# Patient Record
Sex: Female | Born: 1980 | Race: Black or African American | Hispanic: No | Marital: Married | Smoking: Never smoker
Health system: Southern US, Community
[De-identification: ages and names within clinical notes are randomized; demographics above are authoritative.]

## PROBLEM LIST (undated history)

## (undated) ENCOUNTER — Inpatient Hospital Stay (HOSPITAL_COMMUNITY): Payer: Self-pay

## (undated) DIAGNOSIS — G971 Other reaction to spinal and lumbar puncture: Secondary | ICD-10-CM

## (undated) DIAGNOSIS — F419 Anxiety disorder, unspecified: Secondary | ICD-10-CM

## (undated) DIAGNOSIS — B999 Unspecified infectious disease: Secondary | ICD-10-CM

## (undated) DIAGNOSIS — R87629 Unspecified abnormal cytological findings in specimens from vagina: Secondary | ICD-10-CM

## (undated) DIAGNOSIS — G4700 Insomnia, unspecified: Secondary | ICD-10-CM

## (undated) DIAGNOSIS — Z22322 Carrier or suspected carrier of Methicillin resistant Staphylococcus aureus: Secondary | ICD-10-CM

## (undated) DIAGNOSIS — D649 Anemia, unspecified: Secondary | ICD-10-CM

## (undated) DIAGNOSIS — O139 Gestational [pregnancy-induced] hypertension without significant proteinuria, unspecified trimester: Secondary | ICD-10-CM

## (undated) HISTORY — PX: COLPOSCOPY: SHX161

## (undated) HISTORY — DX: Anxiety disorder, unspecified: F41.9

## (undated) HISTORY — DX: Other reaction to spinal and lumbar puncture: G97.1

## (undated) HISTORY — PX: ABDOMINAL HYSTERECTOMY: SHX81

## (undated) HISTORY — DX: Gestational (pregnancy-induced) hypertension without significant proteinuria, unspecified trimester: O13.9

## (undated) HISTORY — DX: Unspecified abnormal cytological findings in specimens from vagina: R87.629

---
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
## 2008-03-28 ENCOUNTER — Emergency Department (HOSPITAL_COMMUNITY): Admission: EM | Admit: 2008-03-28 | Discharge: 2008-03-28 | Payer: Self-pay | Admitting: Emergency Medicine

## 2008-06-08 DIAGNOSIS — E669 Obesity, unspecified: Secondary | ICD-10-CM

## 2008-06-22 ENCOUNTER — Inpatient Hospital Stay (HOSPITAL_COMMUNITY): Admission: AD | Admit: 2008-06-22 | Discharge: 2008-06-22 | Payer: Self-pay | Admitting: Obstetrics & Gynecology

## 2008-07-07 ENCOUNTER — Ambulatory Visit: Payer: Self-pay | Admitting: Family Medicine

## 2008-07-07 ENCOUNTER — Inpatient Hospital Stay (HOSPITAL_COMMUNITY): Admission: AD | Admit: 2008-07-07 | Discharge: 2008-07-07 | Payer: Self-pay | Admitting: Obstetrics

## 2008-07-28 ENCOUNTER — Ambulatory Visit (HOSPITAL_COMMUNITY): Admission: RE | Admit: 2008-07-28 | Discharge: 2008-07-28 | Payer: Self-pay | Admitting: Obstetrics & Gynecology

## 2010-03-08 IMAGING — US US OB TRANSVAGINAL
1 series · 14 of 16 positions shown · non-contrast
Comparison: none

OBSTETRICAL ULTRASOUND:
 This ultrasound exam was performed in the [HOSPITAL] Ultrasound Department.  The OB US report was generated in the AS system, and faxed to the ordering physician.  This report is also available in [REDACTED] PACS.

[Series 1: us ob transvaginal · 0.20mm/px · 16 acquisitions, 14 frames shown]
[im 1/16]
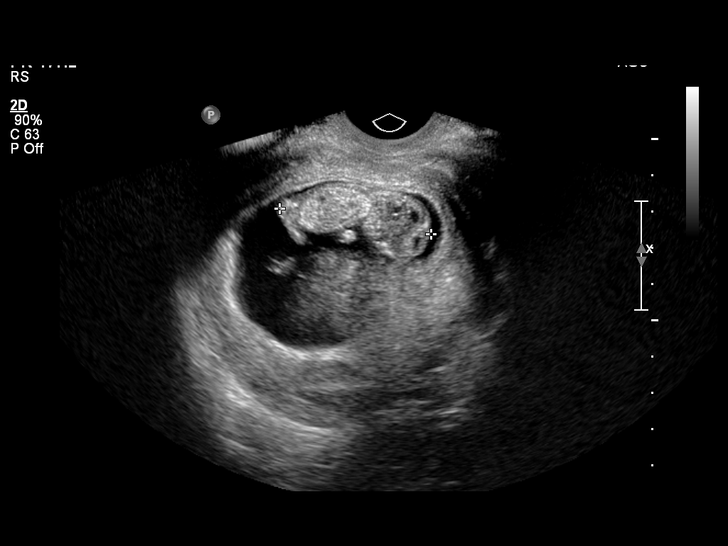
[im 2/16]
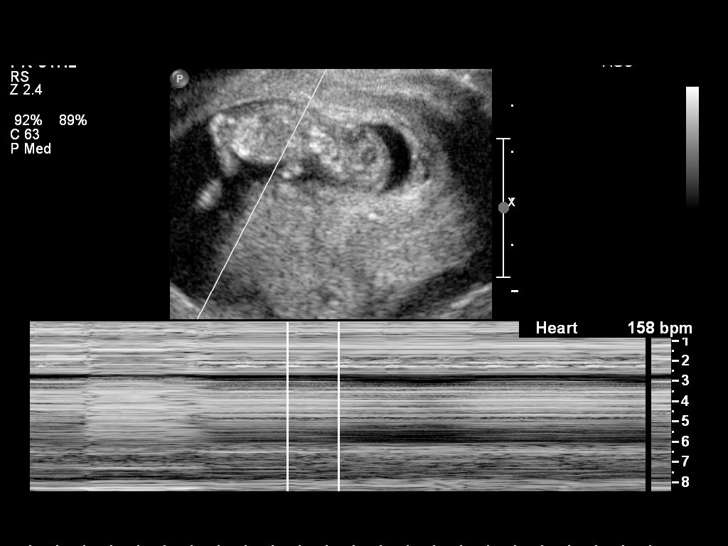
[im 3/16]
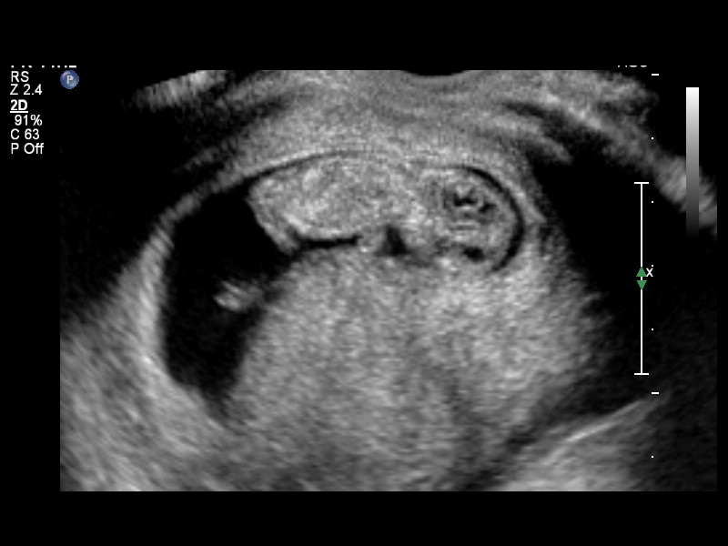
[im 5/16]
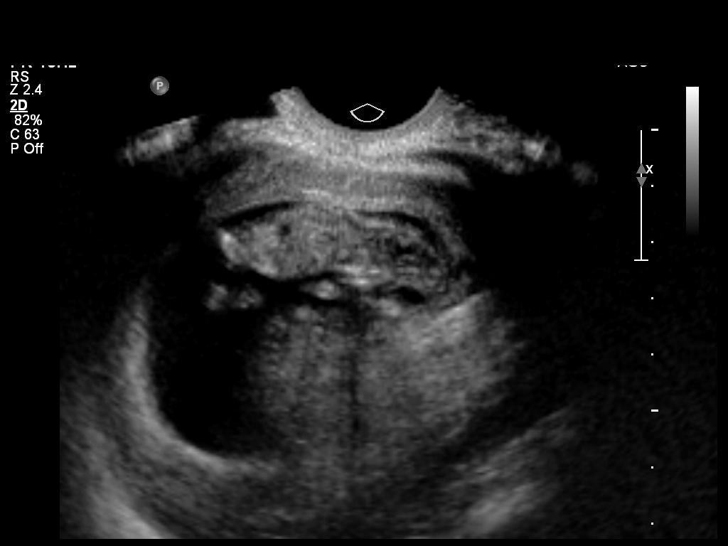
[im 6/16]
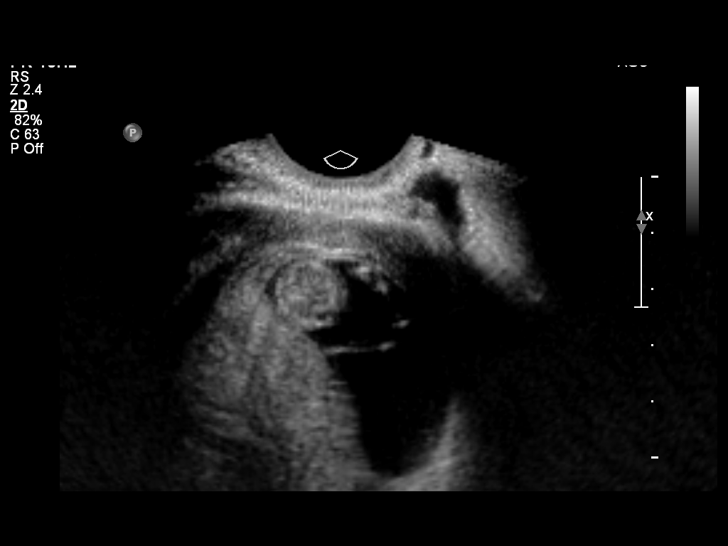
[im 7/16]
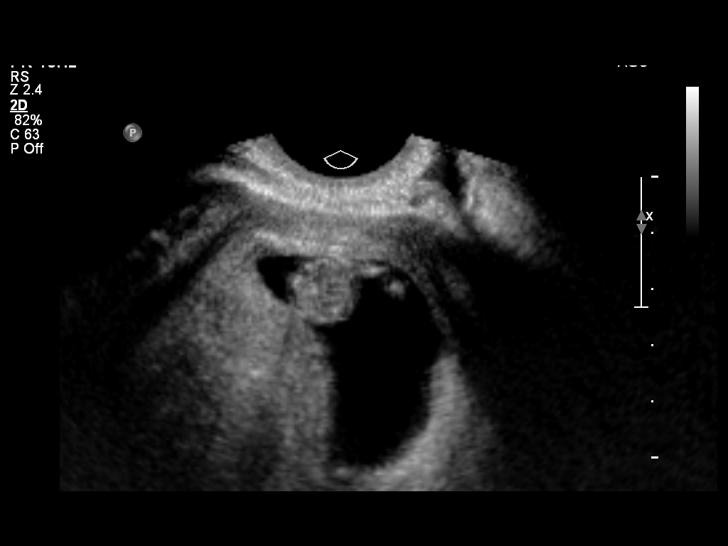
[im 8/16]
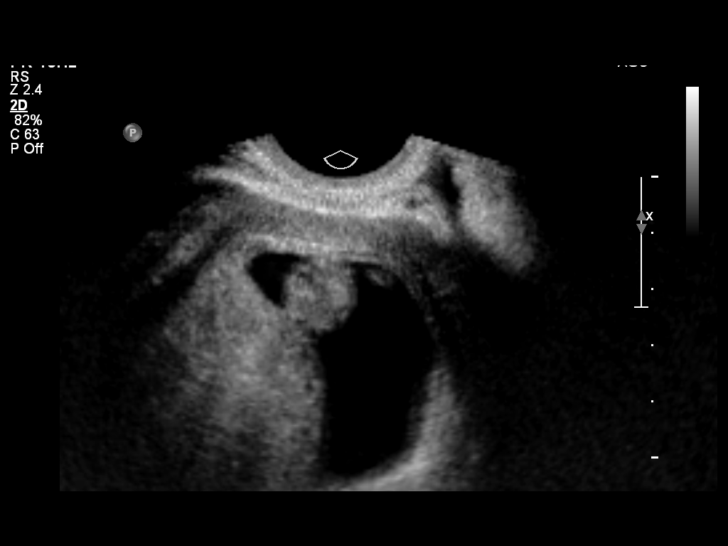
[im 9/16]
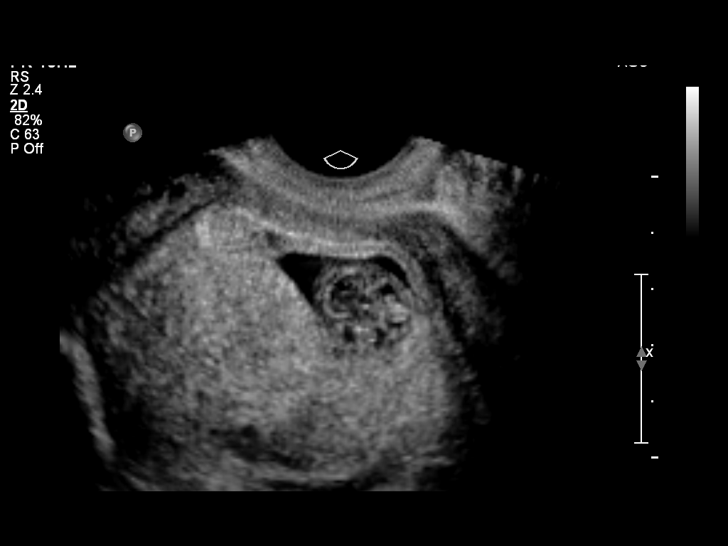
[im 10/16]
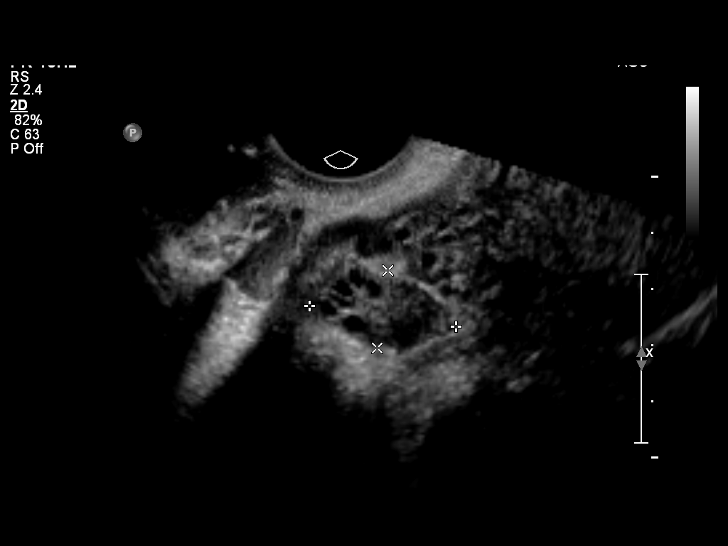
[im 11/16]
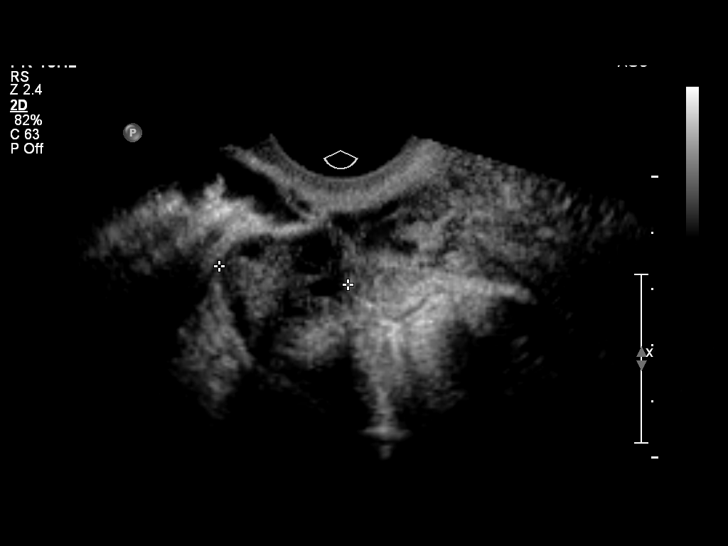
[im 13/16]
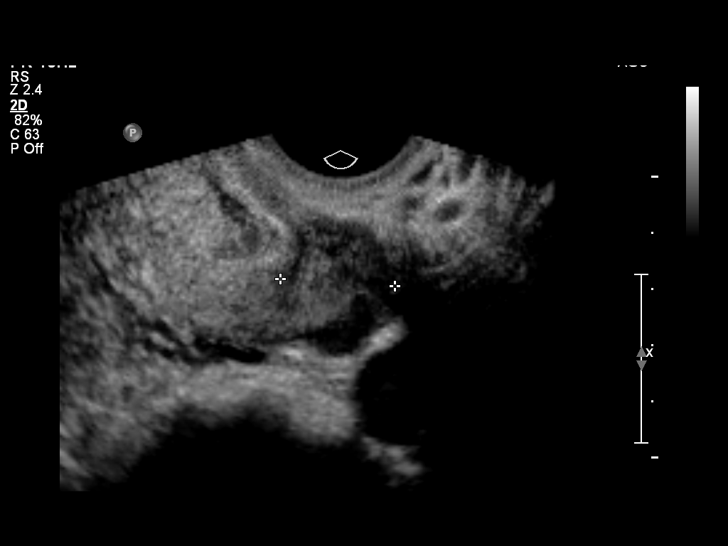
[im 14/16]
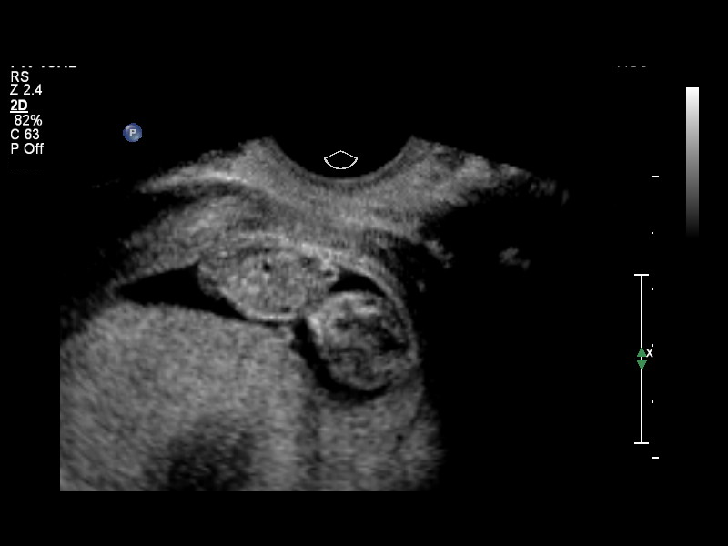
[im 15/16]
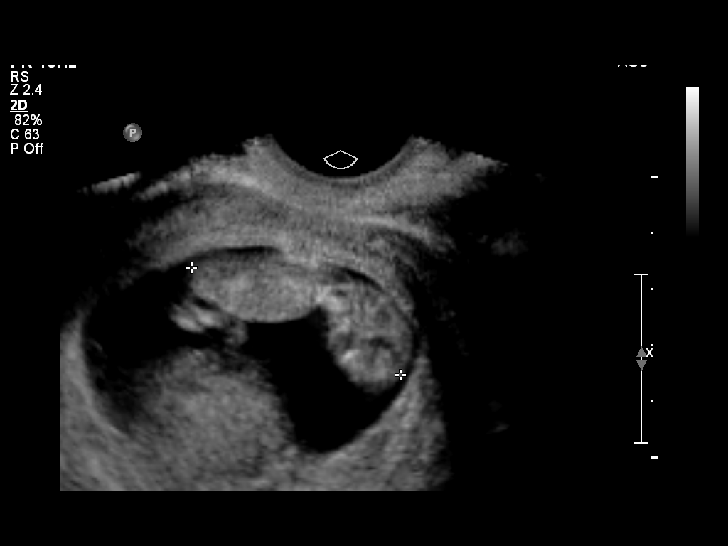
[im 16/16]
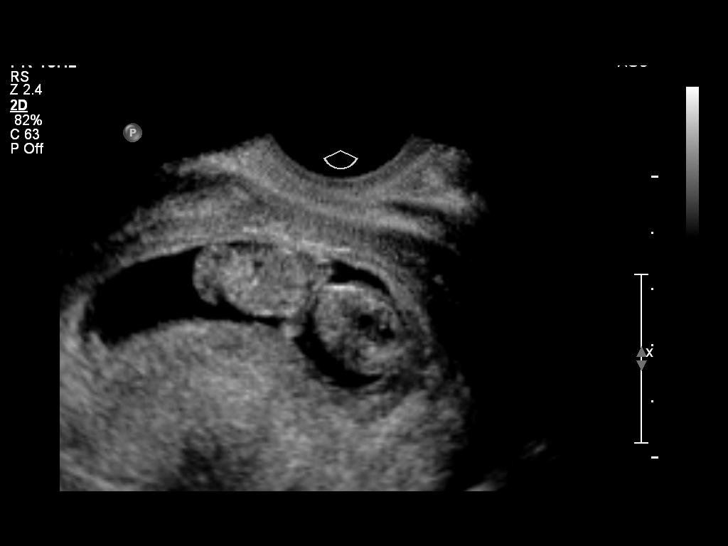

[14 of 16 positions shown; findings below may reference images not displayed]

IMPRESSION: See AS Obstetric US report.

## 2010-05-12 ENCOUNTER — Encounter: Payer: Self-pay | Admitting: Obstetrics & Gynecology

## 2010-05-12 ENCOUNTER — Encounter: Payer: Self-pay | Admitting: Obstetrics

## 2010-08-01 LAB — CBC
HCT: 33.3 % — ABNORMAL LOW (ref 36.0–46.0)
HCT: 35.1 % — ABNORMAL LOW (ref 36.0–46.0)
Hemoglobin: 10.7 g/dL — ABNORMAL LOW (ref 12.0–15.0)
Platelets: 270 10*3/uL (ref 150–400)
Platelets: 275 10*3/uL (ref 150–400)
RDW: 16 % — ABNORMAL HIGH (ref 11.5–15.5)
WBC: 7.4 10*3/uL (ref 4.0–10.5)
WBC: 7.9 10*3/uL (ref 4.0–10.5)

## 2010-08-01 LAB — ABO/RH: ABO/RH(D): O POS

## 2010-08-01 LAB — URINALYSIS, ROUTINE W REFLEX MICROSCOPIC
Bilirubin Urine: NEGATIVE
Glucose, UA: NEGATIVE mg/dL
Nitrite: NEGATIVE
Protein, ur: NEGATIVE mg/dL
Specific Gravity, Urine: 1.015 (ref 1.005–1.030)
pH: 6 (ref 5.0–8.0)
pH: 6.5 (ref 5.0–8.0)

## 2010-08-01 LAB — WET PREP, GENITAL: Yeast Wet Prep HPF POC: NONE SEEN

## 2010-08-01 LAB — GC/CHLAMYDIA PROBE AMP, GENITAL
Chlamydia, DNA Probe: NEGATIVE
Chlamydia, DNA Probe: NEGATIVE
GC Probe Amp, Genital: NEGATIVE
GC Probe Amp, Genital: NEGATIVE

## 2010-08-01 LAB — HCG, QUANTITATIVE, PREGNANCY: hCG, Beta Chain, Quant, S: 6004 m[IU]/mL — ABNORMAL HIGH (ref <5)

## 2011-01-24 LAB — URINALYSIS, ROUTINE W REFLEX MICROSCOPIC
Ketones, ur: NEGATIVE mg/dL
Nitrite: NEGATIVE
Protein, ur: NEGATIVE mg/dL
Urobilinogen, UA: 0.2 mg/dL (ref 0.0–1.0)

## 2011-01-24 LAB — POCT PREGNANCY, URINE: Preg Test, Ur: NEGATIVE

## 2011-01-24 LAB — WET PREP, GENITAL: WBC, Wet Prep HPF POC: NONE SEEN

## 2011-08-07 DIAGNOSIS — R6889 Other general symptoms and signs: Secondary | ICD-10-CM | POA: Insufficient documentation

## 2011-08-08 DIAGNOSIS — N390 Urinary tract infection, site not specified: Secondary | ICD-10-CM | POA: Insufficient documentation

## 2011-08-08 DIAGNOSIS — N949 Unspecified condition associated with female genital organs and menstrual cycle: Secondary | ICD-10-CM | POA: Insufficient documentation

## 2011-10-08 DIAGNOSIS — Z98891 History of uterine scar from previous surgery: Secondary | ICD-10-CM | POA: Insufficient documentation

## 2011-10-08 DIAGNOSIS — O09899 Supervision of other high risk pregnancies, unspecified trimester: Secondary | ICD-10-CM | POA: Insufficient documentation

## 2011-10-08 DIAGNOSIS — O0930 Supervision of pregnancy with insufficient antenatal care, unspecified trimester: Secondary | ICD-10-CM | POA: Insufficient documentation

## 2011-12-10 DIAGNOSIS — O34219 Maternal care for unspecified type scar from previous cesarean delivery: Secondary | ICD-10-CM | POA: Insufficient documentation

## 2014-03-28 ENCOUNTER — Encounter (HOSPITAL_COMMUNITY): Payer: Self-pay

## 2014-03-28 ENCOUNTER — Emergency Department (HOSPITAL_COMMUNITY)
Admission: EM | Admit: 2014-03-28 | Discharge: 2014-03-29 | Disposition: A | Discharge status: Home / Self Care | Payer: Medicaid Other | Attending: Emergency Medicine | Admitting: Emergency Medicine

## 2014-03-28 DIAGNOSIS — Z8614 Personal history of Methicillin resistant Staphylococcus aureus infection: Secondary | ICD-10-CM | POA: Insufficient documentation

## 2014-03-28 DIAGNOSIS — Z880 Allergy status to penicillin: Secondary | ICD-10-CM | POA: Diagnosis not present

## 2014-03-28 DIAGNOSIS — L02414 Cutaneous abscess of left upper limb: Secondary | ICD-10-CM | POA: Insufficient documentation

## 2014-03-28 DIAGNOSIS — R51 Headache: Secondary | ICD-10-CM | POA: Insufficient documentation

## 2014-03-28 DIAGNOSIS — R5383 Other fatigue: Secondary | ICD-10-CM | POA: Insufficient documentation

## 2014-03-28 DIAGNOSIS — L089 Local infection of the skin and subcutaneous tissue, unspecified: Secondary | ICD-10-CM | POA: Diagnosis present

## 2014-03-28 HISTORY — DX: Carrier or suspected carrier of methicillin resistant Staphylococcus aureus: Z22.322

## 2014-03-28 NOTE — ED Notes (Signed)
Pt presents with cellulitis to several areas to bilateral arm, Right arm is healing but still noticeable, Left arm started tonight. Pt reports a hx of MRSA from 2009

## 2014-03-28 NOTE — ED Provider Notes (Signed)
CSN: [id number]     Arrival date & time 03/28/14  2321 History  This chart was scribe for April Raceravid Bernadean Saling, MD by Angelene GiovanniEmmanuella Mensah, ED Scribe. The patient was seen in room A12C/A12C and the patient's care was started at 12:00 AM.    Chief Complaint  Patient presents with  . Recurrent Skin Infections   The history is provided by the patient. No language interpreter was used.   HPI Comments: April Gregory is a 33 y.o. female with a hx of MRSA from 2009 who presents to the Emergency Department complaining of abscess on her right elbow onset 2 weeks ago which has improved. Pt now has abscess to her left elbow onset 1 week. She complains of mild left axilla tenderness. She denies drainage. She reports mild associated HA and fatigue but denies fever. Patient has a history of multiple abscesses in the past. No PCP.   Past Medical History  Diagnosis Date  . MRSA (methicillin resistant staph aureus) culture positive    Past Surgical History  Procedure Laterality Date  . Cesarean section     History reviewed. No pertinent family history. History  Substance Use Topics  . Smoking status: Never Smoker   . Smokeless tobacco: Not on file  . Alcohol Use: Yes     Comment: social   OB History    No data available     Review of Systems  Constitutional: Positive for fatigue. Negative for fever and chills.  Respiratory: Negative for cough and shortness of breath.   Cardiovascular: Negative for chest pain and palpitations.  Gastrointestinal: Negative for nausea, vomiting, abdominal pain and diarrhea.  Musculoskeletal: Negative for back pain, neck pain and neck stiffness.  Skin: Positive for rash.       Cellulitis on her right and left elbow  Neurological: Positive for headaches. Negative for dizziness, weakness and numbness.  All other systems reviewed and are negative.     Allergies  Penicillins  Home Medications   Prior to Admission medications   Medication Sig Start Date End Date  Taking? Authorizing Provider  clindamycin (CLEOCIN) 300 MG capsule Take 1 capsule (300 mg total) by mouth 4 (four) times daily. X 7 days 03/29/14   April Raceravid Mirakle Tomlin, MD   BP 112/71 mmHg  Pulse 97  Temp(Src) 98 F (36.7 C) (Oral)  Resp 17  SpO2 100%  LMP 03/15/2014 Physical Exam  Constitutional: She is oriented to person, place, and time. She appears well-developed and well-nourished. No distress.  HENT:  Head: Normocephalic and atraumatic.  Mouth/Throat: Oropharynx is clear and moist.  Eyes: EOM are normal. Pupils are equal, round, and reactive to light.  Neck: Normal range of motion. Neck supple.  Cardiovascular: Normal rate and regular rhythm.   Pulmonary/Chest: Effort normal and breath sounds normal. No respiratory distress. She has no wheezes. She has no rales. She exhibits no tenderness.  Abdominal: Soft. Bowel sounds are normal. She exhibits no distension and no mass. There is no tenderness. There is no rebound and no guarding.  Musculoskeletal: Normal range of motion. She exhibits no edema or tenderness.  Neurological: She is alert and oriented to person, place, and time.  Skin: Skin is warm and dry. No rash noted. No erythema.  Patient has 2 areas of hyperpigmentation and induration to the right elbow both roughly with diameters of 1-2 cm. There is no tenderness. No overlying erythema. There is no fluctuance. Appear to be healing abscesses. Patient also has an superficial abscess that is 2 cm in  diameter to the left elbow. There is fluctuant. There is some mild surrounding erythema. Patient has left axillary lymphadenopathy. No streaking.  Psychiatric: She has a normal mood and affect. Her behavior is normal.  Nursing note and vitals reviewed.   ED Course  INCISION AND DRAINAGE Date/Time: 03/29/2014 12:27 AM Performed by: April RacerYELVERTON, Ruthanna Macchia Authorized by: Ranae PalmsYELVERTON, Ling Flesch Type: abscess Body area: upper extremity Location details: left elbow Scalpel size: 11 Incision type:  single straight Complexity: simple Drainage: purulent Drainage amount: moderate Wound treatment: wound left open Packing material: none   (including critical care time) DIAGNOSTIC STUDIES: Oxygen Saturation is 100% on RA, normal by my interpretation.    COORDINATION OF CARE: 12:06 AM- Pt advised of plan for treatment and pt agrees.    Labs Review Labs Reviewed  WOUND CULTURE    Imaging Review No results found.   EKG Interpretation None      MDM   Final diagnoses:  Abscess of arm, left     I personally performed the services described in this documentation, which was scribed in my presence. The recorded information has been reviewed and is accurate.   Wound culture obtained. Patient given first dose of clindamycin. Advised to return in 24-48 hours to have the wound reevaluated.  April Raceravid Mousa Prout, MD 03/29/14 [phone]

## 2014-03-29 MED ORDER — CLINDAMYCIN HCL 300 MG PO CAPS
300.0000 mg | ORAL_CAPSULE | Freq: Once | ORAL | Status: AC
  Administered 2014-03-29: 300 mg via ORAL
  Filled 2014-03-29: qty 2
  Filled 2014-03-29: qty 1

## 2014-03-29 MED ORDER — CLINDAMYCIN HCL 300 MG PO CAPS: 300.0000 mg | ORAL_CAPSULE | Freq: Four times a day (QID) | ORAL | Status: DC

## 2014-03-29 NOTE — ED Notes (Signed)
Pt a/o x 4 on d/c with steady gait. 

## 2014-03-29 NOTE — Discharge Instructions (Signed)

## 2014-03-31 LAB — WOUND CULTURE

## 2014-08-13 ENCOUNTER — Emergency Department (HOSPITAL_COMMUNITY)
Admission: EM | Admit: 2014-08-13 | Discharge: 2014-08-14 | Disposition: A | Discharge status: Home / Self Care | Payer: Medicaid Other | Attending: Emergency Medicine | Admitting: Emergency Medicine

## 2014-08-13 ENCOUNTER — Encounter (HOSPITAL_COMMUNITY): Payer: Self-pay | Admitting: Adult Health

## 2014-08-13 DIAGNOSIS — R109 Unspecified abdominal pain: Secondary | ICD-10-CM | POA: Diagnosis present

## 2014-08-13 DIAGNOSIS — Z8614 Personal history of Methicillin resistant Staphylococcus aureus infection: Secondary | ICD-10-CM | POA: Insufficient documentation

## 2014-08-13 DIAGNOSIS — M545 Low back pain, unspecified: Secondary | ICD-10-CM

## 2014-08-13 DIAGNOSIS — N946 Dysmenorrhea, unspecified: Secondary | ICD-10-CM | POA: Diagnosis not present

## 2014-08-13 DIAGNOSIS — Z3202 Encounter for pregnancy test, result negative: Secondary | ICD-10-CM | POA: Insufficient documentation

## 2014-08-13 DIAGNOSIS — Z880 Allergy status to penicillin: Secondary | ICD-10-CM | POA: Insufficient documentation

## 2014-08-13 LAB — URINALYSIS, ROUTINE W REFLEX MICROSCOPIC
Bilirubin Urine: NEGATIVE
Glucose, UA: NEGATIVE mg/dL
Hgb urine dipstick: NEGATIVE
Ketones, ur: NEGATIVE mg/dL
LEUKOCYTES UA: NEGATIVE
NITRITE: NEGATIVE
PROTEIN: NEGATIVE mg/dL
Specific Gravity, Urine: 1.025 (ref 1.005–1.030)
UROBILINOGEN UA: 1 mg/dL (ref 0.0–1.0)
pH: 6.5 (ref 5.0–8.0)

## 2014-08-13 LAB — POC URINE PREG, ED: Preg Test, Ur: NEGATIVE

## 2014-08-13 NOTE — ED Notes (Signed)
Presents with lower back and lower abdominal pain associated with irregular periods that began in August and severe cramps, periods have not really stopped and have large clots. Reports today she has not bled. Pain radiates into her thighs. Endorses urinary frequency and urgency, dysuria.

## 2014-08-13 NOTE — ED Notes (Signed)
MD name Teola Bradley(Nihad Yasmin) was added in error. He is not caring for this patient. This patient is under the care of Dr. Fayrene FearingJames.

## 2014-08-13 NOTE — ED Notes (Signed)
MD at bedside. 

## 2014-08-14 LAB — CBC WITH DIFFERENTIAL/PLATELET
Basophils Absolute: 0 10*3/uL (ref 0.0–0.1)
Basophils Relative: 0 % (ref 0–1)
EOS PCT: 4 % (ref 0–5)
Eosinophils Absolute: 0.3 10*3/uL (ref 0.0–0.7)
HCT: 34.3 % — ABNORMAL LOW (ref 36.0–46.0)
HEMOGLOBIN: 10.5 g/dL — AB (ref 12.0–15.0)
LYMPHS PCT: 46 % (ref 12–46)
Lymphs Abs: 3.4 10*3/uL (ref 0.7–4.0)
MCH: 21.6 pg — ABNORMAL LOW (ref 26.0–34.0)
MCHC: 30.6 g/dL (ref 30.0–36.0)
MCV: 70.7 fL — ABNORMAL LOW (ref 78.0–100.0)
MONOS PCT: 5 % (ref 3–12)
Monocytes Absolute: 0.4 10*3/uL (ref 0.1–1.0)
Neutro Abs: 3.4 10*3/uL (ref 1.7–7.7)
Neutrophils Relative %: 45 % (ref 43–77)
Platelets: 307 10*3/uL (ref 150–400)
RBC: 4.85 MIL/uL (ref 3.87–5.11)
RDW: 18.4 % — AB (ref 11.5–15.5)
WBC: 7.5 10*3/uL (ref 4.0–10.5)

## 2014-08-14 MED ORDER — NAPROXEN 500 MG PO TABS: 500.0000 mg | ORAL_TABLET | Freq: Two times a day (BID) | ORAL | Status: DC

## 2014-08-14 MED ORDER — TRAMADOL HCL 50 MG PO TABS
50.0000 mg | ORAL_TABLET | Freq: Four times a day (QID) | ORAL | Status: DC | PRN
Start: 2014-08-14 — End: 2014-09-21

## 2014-08-14 NOTE — Discharge Instructions (Signed)
Follow up with [facility] regarding your abnormal bleeding and pelvic discomfort.  Back Pain, Adult Back pain is very common. The pain often gets better over time. The cause of back pain is usually not dangerous. Most people can learn to manage their back pain on their own.  HOME CARE   Stay active. Start with short walks on flat ground if you can. Try to walk farther each day.  Do not sit, drive, or stand in one place for more than 30 minutes. Do not stay in bed.  Do not avoid exercise or work. Activity can help your back heal faster.  Be careful when you bend or lift an object. Bend at your knees, keep the object close to you, and do not twist.  Sleep on a firm mattress. Lie on your side, and bend your knees. If you lie on your back, put a pillow under your knees.  Only take medicines as told by your doctor.  Put ice on the injured area.  Put ice in a plastic bag.  Place a towel between your skin and the bag.  Leave the ice on for 15-20 minutes, 03-04 times a day for the first 2 to 3 days. After that, you can switch between ice and heat packs.  Ask your doctor about back exercises or massage.  Avoid feeling anxious or stressed. Find good ways to deal with stress, such as exercise. GET HELP RIGHT AWAY IF:   Your pain does not go away with rest or medicine.  Your pain does not go away in 1 week.  You have new problems.  You do not feel well.  The pain spreads into your legs.  You cannot control when you poop (bowel movement) or pee (urinate).  Your arms or legs feel weak or lose feeling (numbness).  You feel sick to your stomach (nauseous) or throw up (vomit).  You have belly (abdominal) pain.  You feel like you may pass out (faint). MAKE SURE YOU:   Understand these instructions.  Will watch your condition.  Will get help right away if you are not doing well or get worse. Document Released: 09/24/2007 Document Revised: 06/30/2011 Document  Reviewed: 08/09/2013 [facility] Patient Information 2015 [employer], [place]. This information is not intended to replace advice given to you by your health care provider. Make sure you discuss any questions you have with your health care provider.

## 2014-08-14 NOTE — ED Provider Notes (Signed)
CSN: [id number]     Arrival date & time 08/13/14  2017 History   First MD Initiated Contact with Patient 08/13/14 2223     Chief Complaint  Patient presents with  . Abdominal Pain      HPI  She presents for evaluation of vaginal bleeding, back pain, pelvic pain. She states the last 9 months she's had frequent heavy periods. Sometimes lasting up to 3 weeks at a time. Is not syncopal or presyncopal. Occasionally will fill weak. Has not been seen by GYN. His been told the past she "might have fibroids". States that she has bilateral low back pain. Radiates up her buttocks, but not into her legs. No numbness weakness taking. No falls injuries trauma. No change in bowel or bladder habits. Does have some urinary urgency but no incontinence or retention.  Past Medical History  Diagnosis Date  . MRSA (methicillin resistant staph aureus) culture positive    Past Surgical History  Procedure Laterality Date  . Cesarean section     History reviewed. No pertinent family history. History  Substance Use Topics  . Smoking status: Never Smoker   . Smokeless tobacco: Not on file  . Alcohol Use: Yes     Comment: social   OB History    No data available     Review of Systems  Constitutional: Negative for fever, chills, diaphoresis, appetite change and fatigue.  HENT: Negative for mouth sores, sore throat and trouble swallowing.   Eyes: Negative for visual disturbance.  Respiratory: Negative for cough, chest tightness, shortness of breath and wheezing.   Cardiovascular: Negative for chest pain.  Gastrointestinal: Negative for nausea, vomiting, abdominal pain, diarrhea and abdominal distention.  Endocrine: Negative for polydipsia, polyphagia and polyuria.  Genitourinary: Positive for frequency and pelvic pain. Negative for dysuria and hematuria.  Musculoskeletal: Negative for gait problem.  Skin: Negative for color change, pallor and rash.  Neurological: Negative for dizziness, syncope,  light-headedness and headaches.  Hematological: Does not bruise/bleed easily.  Psychiatric/Behavioral: Negative for behavioral problems and confusion.      Allergies  Penicillins  Home Medications   Prior to Admission medications   Medication Sig Start Date End Date Taking? Authorizing Provider  clindamycin (CLEOCIN) 300 MG capsule Take 1 capsule (300 mg total) by mouth 4 (four) times daily. X 7 days 03/29/14   Loren Racer, MD  naproxen (NAPROSYN) 500 MG tablet Take 1 tablet (500 mg total) by mouth 2 (two) times daily. 08/14/14   Rolland Porter, MD  traMADol (ULTRAM) 50 MG tablet Take 1 tablet (50 mg total) by mouth every 6 (six) hours as needed. 08/14/14   Rolland Porter, MD   BP 120/89 mmHg  Pulse 99  Temp(Src) 98.3 F (36.8 C) (Oral)  Resp 20  SpO2 99%  LMP 08/01/2014 (Approximate) Physical Exam  Constitutional: She is oriented to person, place, and time. She appears well-developed and well-nourished. No distress.  HENT:  Head: Normocephalic.  Eyes: Conjunctivae are normal. Pupils are equal, round, and reactive to light. No scleral icterus.  Neck: Normal range of motion. Neck supple. No thyromegaly present.  Cardiovascular: Normal rate and regular rhythm.  Exam reveals no gallop and no friction rub.   No murmur heard. Pulmonary/Chest: Effort normal and breath sounds normal. No respiratory distress. She has no wheezes. She has no rales.  Abdominal: Soft. Bowel sounds are normal. She exhibits no distension. There is no tenderness. There is no rebound.  Musculoskeletal: Normal range of motion.  Tenderness across the low midline  back. No numbness weakness tingling on her history. She has normal gait and normal strength and sensation testing here.  Neurological: She is alert and oriented to person, place, and time.  Skin: Skin is warm and dry. No rash noted.  Psychiatric: She has a normal mood and affect. Her behavior is normal.    ED Course  Procedures (including critical care  time) Labs Review Labs Reviewed  URINALYSIS, ROUTINE W REFLEX MICROSCOPIC - Abnormal; Notable for the following:    APPearance CLOUDY (*)    All other components within normal limits  CBC WITH DIFFERENTIAL/PLATELET - Abnormal; Notable for the following:    Hemoglobin 10.5 (*)    HCT 34.3 (*)    MCV 70.7 (*)    MCH 21.6 (*)    RDW 18.4 (*)    All other components within normal limits  POC URINE PREG, ED    Imaging Review No results found.   EKG Interpretation None      MDM   Final diagnoses:  Dysmenorrhea  Midline low back pain without sciatica    Patient with normal neurological exam. Her back pain does not have regular flag symptoms. Urine does not appear infected. Not pregnant. Await hemoglobin. The history of her frequent bleeding and urinary urgency or cyanosis would include a fibroid. I do not feel she needs emergent pelvic imaging or further studies or exam tonight. I feel that GYN routine follow-up is all that was required. Simple anti-inflammatories and pain medication for her pain which seems musculoskeletal.    Rolland PorterMark Vernel Langenderfer, MD 08/16/14 1932

## 2014-09-21 ENCOUNTER — Emergency Department (HOSPITAL_COMMUNITY)
Admission: EM | Admit: 2014-09-21 | Discharge: 2014-09-21 | Disposition: A | Discharge status: Home / Self Care | Payer: Medicaid Other | Attending: Emergency Medicine | Admitting: Emergency Medicine

## 2014-09-21 ENCOUNTER — Encounter (HOSPITAL_COMMUNITY): Payer: Self-pay | Admitting: Emergency Medicine

## 2014-09-21 DIAGNOSIS — Z3202 Encounter for pregnancy test, result negative: Secondary | ICD-10-CM | POA: Insufficient documentation

## 2014-09-21 DIAGNOSIS — Z880 Allergy status to penicillin: Secondary | ICD-10-CM | POA: Insufficient documentation

## 2014-09-21 DIAGNOSIS — N939 Abnormal uterine and vaginal bleeding, unspecified: Secondary | ICD-10-CM

## 2014-09-21 DIAGNOSIS — R1030 Lower abdominal pain, unspecified: Secondary | ICD-10-CM | POA: Diagnosis present

## 2014-09-21 LAB — CBC WITH DIFFERENTIAL/PLATELET
BASOS ABS: 0 10*3/uL (ref 0.0–0.1)
Basophils Relative: 0 % (ref 0–1)
EOS ABS: 0.2 10*3/uL (ref 0.0–0.7)
EOS PCT: 3 % (ref 0–5)
HCT: 33.3 % — ABNORMAL LOW (ref 36.0–46.0)
Hemoglobin: 10.4 g/dL — ABNORMAL LOW (ref 12.0–15.0)
LYMPHS ABS: 3 10*3/uL (ref 0.7–4.0)
Lymphocytes Relative: 43 % (ref 12–46)
MCH: 21.4 pg — ABNORMAL LOW (ref 26.0–34.0)
MCHC: 31.2 g/dL (ref 30.0–36.0)
MCV: 68.7 fL — AB (ref 78.0–100.0)
MONOS PCT: 3 % (ref 3–12)
Monocytes Absolute: 0.2 10*3/uL (ref 0.1–1.0)
NEUTROS ABS: 3.5 10*3/uL (ref 1.7–7.7)
Neutrophils Relative %: 51 % (ref 43–77)
PLATELETS: 341 10*3/uL (ref 150–400)
RBC: 4.85 MIL/uL (ref 3.87–5.11)
RDW: 17.9 % — ABNORMAL HIGH (ref 11.5–15.5)
WBC: 6.9 10*3/uL (ref 4.0–10.5)

## 2014-09-21 LAB — BASIC METABOLIC PANEL
ANION GAP: 8 (ref 5–15)
BUN: 8 mg/dL (ref 6–20)
CALCIUM: 8.8 mg/dL — AB (ref 8.9–10.3)
CO2: 25 mmol/L (ref 22–32)
Chloride: 103 mmol/L (ref 101–111)
Creatinine, Ser: 0.93 mg/dL (ref 0.44–1.00)
GFR calc Af Amer: >60 mL/min (ref >60)
GFR calc non Af Amer: >60 mL/min (ref >60)
Glucose, Bld: 96 mg/dL (ref 65–99)
POTASSIUM: 3.7 mmol/L (ref 3.5–5.1)
Sodium: 136 mmol/L (ref 135–145)

## 2014-09-21 LAB — URINALYSIS, ROUTINE W REFLEX MICROSCOPIC
BILIRUBIN URINE: NEGATIVE
Glucose, UA: NEGATIVE mg/dL
Ketones, ur: NEGATIVE mg/dL
NITRITE: NEGATIVE
PH: 6 (ref 5.0–8.0)
Protein, ur: NEGATIVE mg/dL
Specific Gravity, Urine: 1.015 (ref 1.005–1.030)
UROBILINOGEN UA: 1 mg/dL (ref 0.0–1.0)

## 2014-09-21 LAB — URINE MICROSCOPIC-ADD ON

## 2014-09-21 LAB — POC URINE PREG, ED: Preg Test, Ur: NEGATIVE

## 2014-09-21 MED ORDER — METOCLOPRAMIDE HCL 10 MG PO TABS: 10.0000 mg | ORAL_TABLET | Freq: Once | ORAL | Status: DC

## 2014-09-21 MED ORDER — METRONIDAZOLE 500 MG PO TABS: 500.0000 mg | ORAL_TABLET | Freq: Two times a day (BID) | ORAL | Status: DC

## 2014-09-21 MED ORDER — METRONIDAZOLE 500 MG PO TABS
2000.0000 mg | ORAL_TABLET | Freq: Once | ORAL | Status: AC
  Administered 2014-09-21: 2000 mg via ORAL
  Filled 2014-09-21: qty 4

## 2014-09-21 MED ORDER — ONDANSETRON 4 MG PO TBDP
8.0000 mg | ORAL_TABLET | Freq: Once | ORAL | Status: AC
  Administered 2014-09-21: 8 mg via ORAL
  Filled 2014-09-21: qty 2

## 2014-09-21 NOTE — ED Notes (Signed)
pts vital signs updated pt getting dressed and waiting for discharge paperwork at bedside.

## 2014-09-21 NOTE — Discharge Instructions (Signed)
Read the information below.  You may return to the Emergency Department at any time for worsening condition or any new symptoms that concern you.  If you develop high fevers, worsening abdominal pain, uncontrolled vomiting, or are unable to tolerate fluids by mouth, return to the ER for a recheck.   If you develop fevers, loss of control of bowel or bladder, weakness or numbness in your legs, or are unable to walk, return to the ER for a recheck.     Abnormal Uterine Bleeding Abnormal uterine bleeding can affect women at various stages in life, including teenagers, women in their reproductive years, pregnant women, and women who have reached menopause. Several kinds of uterine bleeding are considered abnormal, including:  Bleeding or spotting between periods.   Bleeding after sexual intercourse.   Bleeding that is heavier or more than normal.   Periods that last longer than usual.  Bleeding after menopause.  Many cases of abnormal uterine bleeding are minor and simple to treat, while others are more serious. Any type of abnormal bleeding should be evaluated by your health care provider. Treatment will depend on the cause of the bleeding. HOME CARE INSTRUCTIONS Monitor your condition for any changes. The following actions may help to alleviate any discomfort you are experiencing:  Avoid the use of tampons and douches as directed by your health care provider.  Change your pads frequently. You should get regular pelvic exams and Pap tests. Keep all follow-up appointments for diagnostic tests as directed by your health care provider.  SEEK MEDICAL CARE IF:   Your bleeding lasts more than 1 week.   You feel dizzy at times.  SEEK IMMEDIATE MEDICAL CARE IF:   You pass out.   You are changing pads every 15 to 30 minutes.   You have abdominal pain.  You have a fever.   You become sweaty or weak.   You are passing large blood clots from the vagina.   You start to feel  nauseous and vomit. MAKE SURE YOU:   Understand these instructions.  Will watch your condition.  Will get help right away if you are not doing well or get worse. Document Released: 04/07/2005 Document Revised: 04/12/2013 Document Reviewed: 11/04/2012 [facility] Patient Information 2015 [place], [place]. This information is not intended to replace advice given to you by your health care provider. Make sure you discuss any questions you have with your health care provider.

## 2014-09-21 NOTE — ED Notes (Signed)
Pt reports vaginal bleeding with many clots for a month and pelvic pain and lower abdominal pain. Pt reports she was seen two months ago for pelvic pain but was just giving some pain medication.

## 2014-09-21 NOTE — ED Notes (Signed)
Pt remains monitored by blood pressure and pulse ox. Pelvic cart set up at bedside.

## 2014-09-21 NOTE — ED Provider Notes (Signed)
CSN: [id number]     Arrival date & time 09/21/14  1800 History   First MD Initiated Contact with Patient 09/21/14 2002     Chief Complaint  Patient presents with  . Vaginal Bleeding  . Pelvic Pain  . Abdominal Pain     (Consider location/radiation/quality/duration/timing/severity/associated sxs/prior Treatment) HPI   Pt p/w abnormal vaginal bleeding for the past 10 months.  States she has had periods with increasing frequency and increased duration.  Currently she uses 6 pads daily and they are not soaked through, she describes it as a steady stream.  She has occasional short-lived lower abdominal pain only when she is passing a sizeable clot.  Around the same time pt developed lower abdominal pain with radiation into her bilateral legs.  The pain is worse with movement, with getting up from a standing position.  Denies any injury to her back.  With exception of the timeframe, she has not noticed a connection between her back pain and menstrual bleeding.  Denies fevers, chills, loss of control of bowel or bladder, weakness of numbness of the extremities, saddle anesthesia, bowel, urinary complaints.  Patient's significant other is incarcerated and recently told her he tested positive for trichomonas.  She has not been sexually active since his incarceration in 2014.  Denies lightheadedness, dizziness, SOB, syncope.     Past Medical History  Diagnosis Date  . MRSA (methicillin resistant staph aureus) culture positive    Past Surgical History  Procedure Laterality Date  . Cesarean section     No family history on file. History  Substance Use Topics  . Smoking status: Never Smoker   . Smokeless tobacco: Not on file  . Alcohol Use: Yes     Comment: social   OB History    No data available     Review of Systems  All other systems reviewed and are negative.     Allergies  Penicillins  Home Medications   Prior to Admission medications   Not on File   BP 112/61 mmHg  Pulse  82  Temp(Src) 98.5 F (36.9 C) (Oral)  Resp 18  Ht  (1.778 m)  SpO2 100%  LMP 09/21/2014 (Exact Date) Physical Exam  Constitutional: She appears well-developed and well-nourished. No distress.  HENT:  Head: Normocephalic and atraumatic.  Neck: Neck supple.  Cardiovascular: Normal rate and regular rhythm.   Pulmonary/Chest: Effort normal and breath sounds normal. No respiratory distress. She has no wheezes. She has no rales.  Abdominal: Soft. She exhibits no distension and no mass. There is no tenderness. There is no rebound and no guarding.  obese  Musculoskeletal:  Spine nontender, no crepitus, or stepoffs. Lower extremities:  Strength 5/5, sensation intact, distal pulses intact.     Neurological: She is alert.  Skin: She is not diaphoretic.  Nursing note and vitals reviewed.   ED Course  Procedures (including critical care time) Labs Review Labs Reviewed  CBC WITH DIFFERENTIAL/PLATELET - Abnormal; Notable for the following:    Hemoglobin 10.4 (*)    HCT 33.3 (*)    MCV 68.7 (*)    MCH 21.4 (*)    RDW 17.9 (*)    All other components within normal limits  BASIC METABOLIC PANEL - Abnormal; Notable for the following:    Calcium 8.8 (*)    All other components within normal limits  URINALYSIS, ROUTINE W REFLEX MICROSCOPIC (NOT AT [facility]) - Abnormal; Notable for the following:    APPearance CLOUDY (*)  Hgb urine dipstick LARGE (*)    Leukocytes, UA SMALL (*)    All other components within normal limits  URINE MICROSCOPIC-ADD ON - Abnormal; Notable for the following:    Squamous Epithelial / LPF MANY (*)    All other components within normal limits  POC URINE PREG, ED    Imaging Review No results found.   EKG Interpretation None       Pt declines pelvic exam at this time.  Denies injury, denies possible STD.    MDM   Final diagnoses:  Abnormal vaginal bleeding    Afebrile, nontoxic patient with abnormal vaginal bleeding x 10 months.  She has a gyn  appt in the next two weeks.  She is chronically anemic and this is unchanged.  She is not pregnant.  She is not concerned about new STDs.  She has trichomonas and has been treated for this.  She declined pelvic exam and she has an upcoming gyn exam.   Back pain is without red flags - suspect musculoskeletal etiology.  D/C home with GYN follow up.  Discussed result, findings, treatment, and follow up  with patient.  Pt given return precautions.  Pt verbalizes understanding and agrees with plan.         Trixie Dredgemily Jeylin Woodmansee, PA-C 09/21/14 2131  Elwin MochaBlair Walden, MD 09/21/14 [id number]

## 2014-10-03 ENCOUNTER — Encounter: Payer: Self-pay | Admitting: Obstetrics

## 2014-10-03 ENCOUNTER — Ambulatory Visit (INDEPENDENT_AMBULATORY_CARE_PROVIDER_SITE_OTHER): Payer: Medicaid Other | Admitting: Certified Nurse Midwife

## 2014-10-03 VITALS — BP 129/84 | HR 83 | Temp 98.3°F | Ht 70.0 in | Wt >= 6400 oz

## 2014-10-03 DIAGNOSIS — B9689 Other specified bacterial agents as the cause of diseases classified elsewhere: Secondary | ICD-10-CM

## 2014-10-03 DIAGNOSIS — E669 Obesity, unspecified: Secondary | ICD-10-CM

## 2014-10-03 DIAGNOSIS — D500 Iron deficiency anemia secondary to blood loss (chronic): Secondary | ICD-10-CM

## 2014-10-03 DIAGNOSIS — A499 Bacterial infection, unspecified: Secondary | ICD-10-CM | POA: Diagnosis not present

## 2014-10-03 DIAGNOSIS — N760 Acute vaginitis: Secondary | ICD-10-CM

## 2014-10-03 DIAGNOSIS — N939 Abnormal uterine and vaginal bleeding, unspecified: Secondary | ICD-10-CM | POA: Diagnosis not present

## 2014-10-03 LAB — POCT URINALYSIS DIPSTICK
Bilirubin, UA: NEGATIVE
Glucose, UA: NEGATIVE
Ketones, UA: NEGATIVE
Leukocytes, UA: NEGATIVE
NITRITE UA: NEGATIVE
PH UA: 6
Protein, UA: NEGATIVE
Spec Grav, UA: 1.015
UROBILINOGEN UA: NEGATIVE

## 2014-10-03 MED ORDER — TRAMADOL HCL 50 MG PO TABS: 50.0000 mg | ORAL_TABLET | Freq: Four times a day (QID) | ORAL | Status: DC | PRN

## 2014-10-03 MED ORDER — TINIDAZOLE 500 MG PO TABS: ORAL_TABLET | ORAL | Status: DC

## 2014-10-03 MED ORDER — FERIVA 21/7 75-1 MG PO TABS: 1.0000 | ORAL_TABLET | Freq: Every day | ORAL | Status: DC

## 2014-10-03 MED ORDER — TRANEXAMIC ACID 650 MG PO TABS: 1300.0000 mg | ORAL_TABLET | Freq: Three times a day (TID) | ORAL | Status: DC

## 2014-10-03 MED ORDER — CITRANATAL ASSURE 35-1 & 300 MG PO MISC: 1.0000 | Freq: Every day | ORAL | Status: DC

## 2014-10-03 NOTE — Progress Notes (Signed)
Patient ID: April Gregory, female   DOB: 13-Jul-1980, 34 y.o.   MRN: [id number]   Chief Complaint  Patient presents with  . Gynecologic Exam    HPI April Gregory is a 34 y.o. female.  C/O period lasting 6 weeks, increased back pain/abdominal pain.  Recent Trich infection.  Spouse is in jail currently.  Has not had sexual intercourse since January d/t spouses incarceration.  Has 47 year old at home.  In school currently and would like to become a layer.   Not currently on any birth control, would like to have one more child.   HPI  Past Medical History  Diagnosis Date  . MRSA (methicillin resistant staph aureus) culture positive     Past Surgical History  Procedure Laterality Date  . Cesarean section      History reviewed. No pertinent family history.  Social History History  Substance Use Topics  . Smoking status: Never Smoker   . Smokeless tobacco: Not on file  . Alcohol Use: No     Comment: social    Allergies  Allergen Reactions  . Penicillins Other (See Comments)    childhood    Current Outpatient Prescriptions  Medication Sig Dispense Refill  . FeAsp-B12-FA-C-DSS-SuccAc-Zn (FERIVA 21/7) 75-1 MG TABS Take 1 tablet by mouth daily. 28 tablet 1  . metroNIDAZOLE (FLAGYL) 500 MG tablet Take 1 tablet (500 mg total) by mouth 2 (two) times daily. (Patient not taking: Reported on 10/03/2014) 14 tablet 0  . Prenat w/o A-FeCbGl-DSS-FA-DHA (CITRANATAL ASSURE) 35-1 & 300 MG tablet Take 1 tablet by mouth daily. 60 tablet 12  . tinidazole (TINDAMAX) 500 MG tablet Take 2 tablets daily with breakfast. 10 tablet 0  . traMADol (ULTRAM) 50 MG tablet Take 1 tablet (50 mg total) by mouth every 6 (six) hours as needed. 60 tablet 0  . tranexamic acid (LYSTEDA) 650 MG TABS tablet Take 2 tablets (1,300 mg total) by mouth 3 (three) times daily. For the first 5 days of your menstrual cycle. 30 tablet 1   No current facility-administered medications for this visit.    Review of  Systems Review of Systems Constitutional: negative for fatigue and weight loss Respiratory: negative for cough and wheezing Cardiovascular: negative for chest pain, fatigue and palpitations Gastrointestinal: + for abdominal pain and denies any change in bowel habits Genitourinary: + abnormal discharge Integument/breast: negative for nipple discharge Musculoskeletal:negative for myalgias Neurological: negative for gait problems and tremors Behavioral/Psych: negative for abusive relationship, depression Endocrine: negative for temperature intolerance     Blood pressure 129/84, pulse 83, temperature 98.3 F (36.8 C), height  (1.778 m), weight 414 lb (187.789 kg), last menstrual period 08/28/2014.  Physical Exam Physical Exam General:   alert  Skin:   no rash or abnormalities  Lungs:   clear to auscultation bilaterally  Heart:   regular rate and rhythm, S1, S2 normal, no murmur, click, rub or gallop  Breasts:   deferred  Abdomen:  normal findings: no organomegaly, soft, non-tender and no hernia obese  Pelvis:  External genitalia: normal general appearance Urinary system: urethral meatus normal and bladder without fullness, nontender Vaginal: normal without tenderness, induration or masses Cervix: no CMT Adnexa: normal bimanual exam difficult to assess d/t body habitus Uterus: anteverted and non-tender, normal size    50% of 15 min visit spent on counseling and coordination of care.   Data Reviewed [facility] medical hx, labs, meds  Assessment     BV AUB Obesity  Plan    Orders Placed This Encounter  Procedures  . US Transvaginal Non-OB    Standing Status: Future     Number of Occurrences:      Standing Expiration Date: 12/03/2015    Order Specific Question:  Reason for Exam (SYMPTOM  OR DIAGNOSIS REQUIRED)    Answer:  AUB    Order Specific Question:  Preferred imaging location?    Answer:  [facility]  . Referral to Nutrition and Diabetes Services     Referral Priority:  Routine    Referral Type:  Consultation    Referral Reason:  Specialty Services Required    Number of Visits Requested:  1  . POCT urinalysis dipstick   Meds ordered this encounter  Medications  . tinidazole (TINDAMAX) 500 MG tablet    Sig: Take 2 tablets daily with breakfast.    Dispense:  10 tablet    Refill:  0  . traMADol (ULTRAM) 50 MG tablet    Sig: Take 1 tablet (50 mg total) by mouth every 6 (six) hours as needed.    Dispense:  60 tablet    Refill:  0  . tranexamic acid (LYSTEDA) 650 MG TABS tablet    Sig: Take 2 tablets (1,300 mg total) by mouth 3 (three) times daily. For the first 5 days of your menstrual cycle.    Dispense:  30 tablet    Refill:  1  . FeAsp-B12-FA-C-DSS-SuccAc-Zn (FERIVA 21/7) 75-1 MG TABS    Sig: Take 1 tablet by mouth daily.    Dispense:  28 tablet    Refill:  1  . Prenat w/o A-FeCbGl-DSS-FA-DHA (CITRANATAL ASSURE) 35-1 & 300 MG tablet    Sig: Take 1 tablet by mouth daily.    Dispense:  60 tablet    Refill:  12    Follow up with annual exam and contraception.

## 2014-10-03 NOTE — Addendum Note (Signed)
Addended by: Marya Landry D on: 10/03/2014 04:10 PM   Modules accepted: Orders

## 2014-10-06 ENCOUNTER — Other Ambulatory Visit: Payer: Self-pay | Admitting: Certified Nurse Midwife

## 2014-10-06 LAB — SURESWAB, VAGINOSIS/VAGINITIS PLUS
ATOPOBIUM VAGINAE: NOT DETECTED Log (cells/mL)
C. ALBICANS, DNA: NOT DETECTED
C. glabrata, DNA: NOT DETECTED
C. parapsilosis, DNA: NOT DETECTED
C. trachomatis RNA, TMA: NOT DETECTED
C. tropicalis, DNA: NOT DETECTED
Gardnerella vaginalis: NOT DETECTED Log (cells/mL)
LACTOBACILLUS SPECIES: NOT DETECTED Log (cells/mL)
MEGASPHAERA SPECIES: NOT DETECTED Log (cells/mL)
N. GONORRHOEAE RNA, TMA: NOT DETECTED
T. vaginalis RNA, QL TMA: NOT DETECTED

## 2014-10-06 NOTE — Progress Notes (Unsigned)
Left message on voice mail about itching with Tindamax.  Told patient to stop taking it and that she can take up to 50 mg of Benedryl.  If she desires we could try the Hylafem vaginal suppositories for the BV.  Her sure swab is not back yet.  I am hesitant to do the Flagyl since their is a cross reaction.    Orvilla Cornwall, CNM

## 2014-10-10 ENCOUNTER — Ambulatory Visit (HOSPITAL_COMMUNITY): Payer: Medicaid Other

## 2014-10-17 ENCOUNTER — Ambulatory Visit (HOSPITAL_COMMUNITY): Admission: RE | Admit: 2014-10-17 | Payer: Medicaid Other | Source: Ambulatory Visit

## 2014-10-17 ENCOUNTER — Other Ambulatory Visit: Payer: Self-pay | Admitting: Certified Nurse Midwife

## 2014-10-17 DIAGNOSIS — N939 Abnormal uterine and vaginal bleeding, unspecified: Secondary | ICD-10-CM

## 2014-10-24 ENCOUNTER — Ambulatory Visit: Payer: Medicaid Other | Admitting: Certified Nurse Midwife

## 2014-11-07 ENCOUNTER — Ambulatory Visit (HOSPITAL_COMMUNITY): Payer: Medicaid Other

## 2014-11-14 ENCOUNTER — Ambulatory Visit (HOSPITAL_COMMUNITY): Payer: Medicaid Other

## 2014-11-14 ENCOUNTER — Ambulatory Visit: Payer: Medicaid Other | Admitting: Certified Nurse Midwife

## 2014-11-16 ENCOUNTER — Ambulatory Visit: Payer: Medicaid Other | Admitting: Certified Nurse Midwife

## 2014-11-23 ENCOUNTER — Ambulatory Visit (HOSPITAL_COMMUNITY): Payer: Medicaid Other

## 2014-11-30 ENCOUNTER — Ambulatory Visit: Payer: Medicaid Other | Admitting: Certified Nurse Midwife

## 2014-12-07 ENCOUNTER — Ambulatory Visit (HOSPITAL_COMMUNITY): Admission: RE | Admit: 2014-12-07 | Payer: Medicaid Other | Source: Ambulatory Visit

## 2014-12-07 ENCOUNTER — Encounter (HOSPITAL_COMMUNITY): Payer: Self-pay

## 2014-12-07 ENCOUNTER — Emergency Department (HOSPITAL_COMMUNITY)
Admission: EM | Admit: 2014-12-07 | Discharge: 2014-12-07 | Disposition: A | Discharge status: Home / Self Care | Payer: Medicaid Other | Attending: Emergency Medicine | Admitting: Emergency Medicine

## 2014-12-07 DIAGNOSIS — Y998 Other external cause status: Secondary | ICD-10-CM | POA: Diagnosis not present

## 2014-12-07 DIAGNOSIS — S199XXA Unspecified injury of neck, initial encounter: Secondary | ICD-10-CM | POA: Insufficient documentation

## 2014-12-07 DIAGNOSIS — Z8614 Personal history of Methicillin resistant Staphylococcus aureus infection: Secondary | ICD-10-CM | POA: Insufficient documentation

## 2014-12-07 DIAGNOSIS — Y9389 Activity, other specified: Secondary | ICD-10-CM | POA: Insufficient documentation

## 2014-12-07 DIAGNOSIS — S4992XA Unspecified injury of left shoulder and upper arm, initial encounter: Secondary | ICD-10-CM | POA: Diagnosis not present

## 2014-12-07 DIAGNOSIS — Z79899 Other long term (current) drug therapy: Secondary | ICD-10-CM | POA: Insufficient documentation

## 2014-12-07 DIAGNOSIS — Z880 Allergy status to penicillin: Secondary | ICD-10-CM | POA: Insufficient documentation

## 2014-12-07 DIAGNOSIS — Y92410 Unspecified street and highway as the place of occurrence of the external cause: Secondary | ICD-10-CM | POA: Insufficient documentation

## 2014-12-07 DIAGNOSIS — S299XXA Unspecified injury of thorax, initial encounter: Secondary | ICD-10-CM | POA: Insufficient documentation

## 2014-12-07 MED ORDER — NAPROXEN 500 MG PO TABS
500.0000 mg | ORAL_TABLET | Freq: Once | ORAL | Status: AC
  Administered 2014-12-07: 500 mg via ORAL
  Filled 2014-12-07: qty 1

## 2014-12-07 MED ORDER — METHOCARBAMOL 500 MG PO TABS: 500.0000 mg | ORAL_TABLET | Freq: Two times a day (BID) | ORAL | Status: DC

## 2014-12-07 MED ORDER — CYCLOBENZAPRINE HCL 10 MG PO TABS
5.0000 mg | ORAL_TABLET | Freq: Once | ORAL | Status: DC
  Filled 2014-12-07: qty 1

## 2014-12-07 MED ORDER — NAPROXEN 500 MG PO TABS: 500.0000 mg | ORAL_TABLET | Freq: Two times a day (BID) | ORAL | Status: DC
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
## 2014-12-07 NOTE — Discharge Instructions (Signed)

## 2014-12-07 NOTE — ED Notes (Signed)
Pt was ambulatory on scene

## 2015-01-11 ENCOUNTER — Other Ambulatory Visit (INDEPENDENT_AMBULATORY_CARE_PROVIDER_SITE_OTHER): Payer: Medicaid Other

## 2015-01-11 VITALS — BP 128/82 | HR 67 | Temp 98.5°F | Ht 70.0 in | Wt >= 6400 oz

## 2015-01-11 DIAGNOSIS — N926 Irregular menstruation, unspecified: Secondary | ICD-10-CM

## 2015-01-11 DIAGNOSIS — Z3202 Encounter for pregnancy test, result negative: Secondary | ICD-10-CM

## 2015-01-11 LAB — POCT URINE PREGNANCY: Preg Test, Ur: NEGATIVE

## 2015-01-11 NOTE — Progress Notes (Unsigned)
Patient in office today for a pregnancy test. Patient states she has not had a cycle since November 13, 2014. Patient states she has recently been on antibiotics. Patient states she is sexually active without protection. Pregnancy test in office is negative. Patient encouraged to schedule a follow up appointment with th provider to discuss the reason for missed cycle.   BP 128/82 mmHg  Pulse 67  Temp(Src) 98.5 F (36.9 C)  Ht  (1.778 m)  Wt 414 lb (187.789 kg)  BMI 59.40 kg/m2  LMP 12/14/2014

## 2015-01-14 ENCOUNTER — Emergency Department (HOSPITAL_COMMUNITY): Payer: Medicaid Other

## 2015-01-14 ENCOUNTER — Emergency Department (HOSPITAL_COMMUNITY)
Admission: EM | Admit: 2015-01-14 | Discharge: 2015-01-14 | Disposition: A | Discharge status: Home / Self Care | Payer: Medicaid Other | Attending: Emergency Medicine | Admitting: Emergency Medicine

## 2015-01-14 ENCOUNTER — Encounter (HOSPITAL_COMMUNITY): Payer: Self-pay | Admitting: Emergency Medicine

## 2015-01-14 DIAGNOSIS — Z3202 Encounter for pregnancy test, result negative: Secondary | ICD-10-CM | POA: Diagnosis not present

## 2015-01-14 DIAGNOSIS — B9789 Other viral agents as the cause of diseases classified elsewhere: Secondary | ICD-10-CM

## 2015-01-14 DIAGNOSIS — Z880 Allergy status to penicillin: Secondary | ICD-10-CM | POA: Diagnosis not present

## 2015-01-14 DIAGNOSIS — J069 Acute upper respiratory infection, unspecified: Secondary | ICD-10-CM | POA: Diagnosis not present

## 2015-01-14 DIAGNOSIS — Z79899 Other long term (current) drug therapy: Secondary | ICD-10-CM | POA: Diagnosis not present

## 2015-01-14 DIAGNOSIS — R112 Nausea with vomiting, unspecified: Secondary | ICD-10-CM | POA: Diagnosis not present

## 2015-01-14 DIAGNOSIS — R05 Cough: Secondary | ICD-10-CM | POA: Diagnosis present

## 2015-01-14 LAB — I-STAT BETA HCG BLOOD, ED (MC, WL, AP ONLY): I-stat hCG, quantitative: <5 m[IU]/mL (ref <5)

## 2015-01-14 MED ORDER — ALBUTEROL SULFATE HFA 108 (90 BASE) MCG/ACT IN AERS
2.0000 | INHALATION_SPRAY | RESPIRATORY_TRACT | Status: DC | PRN
  Administered 2015-01-14: 2 via RESPIRATORY_TRACT
  Filled 2015-01-14: qty 6.7

## 2015-01-14 MED ORDER — ONDANSETRON 4 MG PO TBDP
4.0000 mg | ORAL_TABLET | Freq: Once | ORAL | Status: DC
  Filled 2015-01-14: qty 1

## 2015-01-14 NOTE — ED Provider Notes (Signed)
CSN: [id number]     Arrival date & time 01/14/15  0900 History   First MD Initiated Contact with Patient 01/14/15 0901     Chief Complaint  Patient presents with  . Emesis  . Cough     (Consider location/radiation/quality/duration/timing/severity/associated sxs/prior Treatment) HPI  Pt presenting with c/o cough, nasal congestion as well as emesis.  Symptoms have been ongoing for the past week.  She has had 2-3 episodes of vomiting- last time was this morning.  She is able to keep down liquids without difficulty.  No diarrhea.  No difficulty breathing.  Her cough is nonproductive.  She states she is blowing her nose frequently.  No specific sick contacts.  There are no other associated systemic symptoms, there are no other alleviating or modifying factors.   Past Medical History  Diagnosis Date  . MRSA (methicillin resistant staph aureus) culture positive    Past Surgical History  Procedure Laterality Date  . Cesarean section     History reviewed. No pertinent family history. Social History  Substance Use Topics  . Smoking status: Never Smoker   . Smokeless tobacco: None  . Alcohol Use: 0.0 oz/week    0 Standard drinks or equivalent per week     Comment: social   OB History    No data available     Review of Systems  ROS reviewed and all otherwise negative except for mentioned in HPI    Allergies  Penicillins and Tindamax  Home Medications   Prior to Admission medications   Medication Sig Start Date End Date Taking? Authorizing Provider  FeAsp-B12-FA-C-DSS-SuccAc-Zn (FERIVA 21/7) 75-1 MG TABS Take 1 tablet by mouth daily. 10/03/14  Yes Rachelle A Denney, CNM  Prenat w/o A-FeCbGl-DSS-FA-DHA (CITRANATAL ASSURE) 35-1 & 300 MG tablet Take 1 tablet by mouth daily. 10/03/14  Yes Rachelle A Denney, CNM  methocarbamol (ROBAXIN) 500 MG tablet Take 1 tablet (500 mg total) by mouth 2 (two) times daily. Patient not taking: Reported on 01/14/2015 12/07/14   Catha Gosselin, PA-C   metroNIDAZOLE (FLAGYL) 500 MG tablet Take 1 tablet (500 mg total) by mouth 2 (two) times daily. Patient not taking: Reported on 10/03/2014 09/21/14   Trixie Dredge, PA-C  naproxen (NAPROSYN) 500 MG tablet Take 1 tablet (500 mg total) by mouth 2 (two) times daily. Patient not taking: Reported on 01/14/2015 12/07/14   Catha Gosselin, PA-C  traMADol (ULTRAM) 50 MG tablet Take 1 tablet (50 mg total) by mouth every 6 (six) hours as needed. Patient not taking: Reported on 01/14/2015 10/03/14   Roe Coombs, CNM  tranexamic acid (LYSTEDA) 650 MG TABS tablet Take 2 tablets (1,300 mg total) by mouth 3 (three) times daily. For the first 5 days of your menstrual cycle. Patient not taking: Reported on 01/14/2015 10/03/14   Rachelle A Denney, CNM   BP 139/74 mmHg  Pulse 87  Temp(Src) 98 F (36.7 C) (Oral)  Resp 18  SpO2 99%  LMP 12/14/2014  Vitals reviewed Physical Exam  Physical Examination: General appearance - alert, well appearing, and in no distress Mental status - alert, oriented to person, place, and time Eyes - no conjunctival injection, no scleral icterus Mouth - mucous membranes moist, pharynx normal without lesions Chest - clear to auscultation, no wheezes, rales or rhonchi, symmetric air entry Heart - normal rate, regular rhythm, normal S1, S2, no murmurs, rubs, clicks or gallops Abdomen - soft, nontender, nondistended, no masses or organomegaly Neurological - alert, oriented, normal speech, Extremities - peripheral pulses normal,  no pedal edema, no clubbing or cyanosis Skin - normal coloration and turgor, no rashes  ED Course  Procedures (including critical care time) Labs Review Labs Reviewed  I-STAT BETA HCG BLOOD, ED ([facility], [facility], [facility] ONLY)    Imaging Review Dg Chest 2 View  01/14/2015   CLINICAL DATA:  Patient with nonproductive cough. Shortness of breath.  EXAM: CHEST  2 VIEW  COMPARISON:  None  FINDINGS: Normal cardiac and mediastinal contours. No consolidative pulmonary  opacities. No pleural effusion or pneumothorax. Regional skeleton is unremarkable.  IMPRESSION: No acute cardiopulmonary process.   Electronically Signed   By: Annia Belt M.D.   On: 01/14/2015 11:00   I have personally reviewed and evaluated these images and lab results as part of my medical decision-making.   EKG Interpretation None      MDM   Final diagnoses:  Viral URI with cough  Non-intractable vomiting with nausea, vomiting of unspecified type    Pt presenting with cough, congestion, intermittent vomiting.   Patient is overall nontoxic and well hydrated in appearance.   CXR shows no pneumonia, pregnancy is negative.  Pt treated with nausea meds and given albuterol inhaler for her symptoms, suspect viral process.  Discharged with strict return precautions.  Pt agreeable with plan.     Jerelyn Scott, MD 01/14/15 [phone]

## 2015-01-14 NOTE — ED Notes (Signed)
Pt reports cough for the past week with nasal congestion and facial pain. Pt also began to have n/v/d on Wednesday. Is able to keep down fluids. LMP July 25, has taken several pregnancy tests, but have come back negative.

## 2015-01-14 NOTE — Discharge Instructions (Signed)
Return to the ED with any concerns including difficulty breathing, vomiting and not able to keep down liquids, decreased urine output, decreased level of alertness/lethargy, or any other alarming symptoms  You can use the albuterol inhaler 2 puffs every 4 hours as needed to help with cough

## 2015-01-17 ENCOUNTER — Other Ambulatory Visit: Payer: Self-pay | Admitting: Certified Nurse Midwife

## 2015-01-17 DIAGNOSIS — N939 Abnormal uterine and vaginal bleeding, unspecified: Secondary | ICD-10-CM

## 2015-01-18 ENCOUNTER — Ambulatory Visit (HOSPITAL_COMMUNITY)
Admission: RE | Admit: 2015-01-18 | Discharge: 2015-01-18 | Disposition: A | Discharge status: Home / Self Care | Payer: Medicaid Other | Source: Ambulatory Visit | Attending: Certified Nurse Midwife | Admitting: Certified Nurse Midwife

## 2015-01-18 ENCOUNTER — Ambulatory Visit (HOSPITAL_COMMUNITY): Payer: Medicaid Other

## 2015-01-18 ENCOUNTER — Other Ambulatory Visit: Payer: Self-pay | Admitting: Certified Nurse Midwife

## 2015-01-18 DIAGNOSIS — N926 Irregular menstruation, unspecified: Secondary | ICD-10-CM | POA: Insufficient documentation

## 2015-01-18 DIAGNOSIS — N939 Abnormal uterine and vaginal bleeding, unspecified: Secondary | ICD-10-CM

## 2015-08-20 ENCOUNTER — Ambulatory Visit: Payer: Medicaid Other | Admitting: Obstetrics

## 2015-08-28 ENCOUNTER — Ambulatory Visit: Payer: Medicaid Other | Admitting: Certified Nurse Midwife

## 2015-09-04 ENCOUNTER — Ambulatory Visit: Payer: Medicaid Other | Admitting: Certified Nurse Midwife

## 2015-12-11 ENCOUNTER — Emergency Department (HOSPITAL_COMMUNITY)
Admission: EM | Admit: 2015-12-11 | Discharge: 2015-12-12 | Disposition: A | Discharge status: Home / Self Care | Payer: Medicaid Other | Attending: Dermatology | Admitting: Dermatology

## 2015-12-11 ENCOUNTER — Encounter (HOSPITAL_COMMUNITY): Payer: Self-pay

## 2015-12-11 DIAGNOSIS — S80861A Insect bite (nonvenomous), right lower leg, initial encounter: Secondary | ICD-10-CM | POA: Insufficient documentation

## 2015-12-11 DIAGNOSIS — Y999 Unspecified external cause status: Secondary | ICD-10-CM | POA: Insufficient documentation

## 2015-12-11 DIAGNOSIS — Y929 Unspecified place or not applicable: Secondary | ICD-10-CM | POA: Insufficient documentation

## 2015-12-11 DIAGNOSIS — W57XXXA Bitten or stung by nonvenomous insect and other nonvenomous arthropods, initial encounter: Secondary | ICD-10-CM | POA: Insufficient documentation

## 2015-12-11 DIAGNOSIS — Y939 Activity, unspecified: Secondary | ICD-10-CM | POA: Insufficient documentation

## 2015-12-11 DIAGNOSIS — Z5321 Procedure and treatment not carried out due to patient leaving prior to being seen by health care provider: Secondary | ICD-10-CM | POA: Insufficient documentation

## 2015-12-11 NOTE — ED Triage Notes (Signed)
Onset 2 days ago pt was outside and felt something bite the back of right calf.  Yesterday calf started hurting and today reddened with pus center.

## 2015-12-12 NOTE — ED Notes (Signed)
Patient left department. Declined to speak with RN.

## 2015-12-20 ENCOUNTER — Encounter (HOSPITAL_COMMUNITY): Payer: Self-pay | Admitting: Emergency Medicine

## 2015-12-20 ENCOUNTER — Emergency Department (HOSPITAL_COMMUNITY)
Admission: EM | Admit: 2015-12-20 | Discharge: 2015-12-21 | Disposition: A | Discharge status: Home / Self Care | Payer: Medicaid Other | Attending: Emergency Medicine | Admitting: Emergency Medicine

## 2015-12-20 DIAGNOSIS — Y92096 Garden or yard of other non-institutional residence as the place of occurrence of the external cause: Secondary | ICD-10-CM | POA: Insufficient documentation

## 2015-12-20 DIAGNOSIS — W57XXXA Bitten or stung by nonvenomous insect and other nonvenomous arthropods, initial encounter: Secondary | ICD-10-CM | POA: Insufficient documentation

## 2015-12-20 DIAGNOSIS — Y999 Unspecified external cause status: Secondary | ICD-10-CM | POA: Insufficient documentation

## 2015-12-20 DIAGNOSIS — Z79899 Other long term (current) drug therapy: Secondary | ICD-10-CM | POA: Insufficient documentation

## 2015-12-20 DIAGNOSIS — Y939 Activity, unspecified: Secondary | ICD-10-CM | POA: Insufficient documentation

## 2015-12-20 DIAGNOSIS — S80861A Insect bite (nonvenomous), right lower leg, initial encounter: Secondary | ICD-10-CM | POA: Insufficient documentation

## 2015-12-20 NOTE — ED Triage Notes (Signed)
Pt states she was bit on her leg by something, she was out in the yard. Two days later it started itching and she scratched it and it started hurting, and getting bigger and bigger. Pt came here on the 22nd and LWBS due to the wait. Bite area is roughly the size of a quarter. Denies fevers chills.

## 2015-12-21 MED ORDER — SILVER SULFADIAZINE 1 % EX CREA
TOPICAL_CREAM | Freq: Once | CUTANEOUS | Status: AC
  Administered 2015-12-21: 01:00:00 via TOPICAL
  Filled 2015-12-21: qty 85

## 2015-12-21 NOTE — Discharge Instructions (Signed)
Apply silvadene to affected area twice daily.

## 2015-12-21 NOTE — ED Provider Notes (Signed)
[facility]-EMERGENCY DEPT Provider Note   CSN: [id number] Arrival date & time: 12/20/15  2221     History   Chief Complaint Chief Complaint  Patient presents with  . Insect Bite    HPI April Gregory is a 35 y.o. female.  The history is provided by the patient. No language interpreter was used.  Rash   This is a new problem. The current episode started more than 1 week ago. The problem has not changed since onset.The problem is associated with a spider bite. There has been no fever. The rash is present on the right lower leg. Associated symptoms include weeping. She has tried antibiotic cream for the symptoms. The treatment provided no relief.    Past Medical History:  Diagnosis Date  . MRSA (methicillin resistant staph aureus) culture positive     There are no active problems to display for this patient.   Past Surgical History:  Procedure Laterality Date  . CESAREAN SECTION      OB History    No data available       Home Medications    Prior to Admission medications   Medication Sig Start Date End Date Taking? Authorizing Provider  FeAsp-B12-FA-C-DSS-SuccAc-Zn (FERIVA 21/7) 75-1 MG TABS Take 1 tablet by mouth daily. 10/03/14   Rachelle A Denney, CNM  methocarbamol (ROBAXIN) 500 MG tablet Take 1 tablet (500 mg total) by mouth 2 (two) times daily. Patient not taking: Reported on 01/14/2015 12/07/14   Catha GosselinHanna Patel-Mills, PA-C  metroNIDAZOLE (FLAGYL) 500 MG tablet Take 1 tablet (500 mg total) by mouth 2 (two) times daily. Patient not taking: Reported on 10/03/2014 09/21/14   Trixie DredgeEmily West, PA-C  naproxen (NAPROSYN) 500 MG tablet Take 1 tablet (500 mg total) by mouth 2 (two) times daily. Patient not taking: Reported on 01/14/2015 12/07/14   Catha GosselinHanna Patel-Mills, PA-C  Prenat w/o A-FeCbGl-DSS-FA-DHA (CITRANATAL ASSURE) 35-1 & 300 MG tablet Take 1 tablet by mouth daily. 10/03/14   Rachelle A Denney, CNM  traMADol (ULTRAM) 50 MG tablet Take 1 tablet (50 mg total) by mouth every 6 (six)  hours as needed. Patient not taking: Reported on 01/14/2015 10/03/14   Roe Coombsachelle A Denney, CNM  tranexamic acid (LYSTEDA) 650 MG TABS tablet Take 2 tablets (1,300 mg total) by mouth 3 (three) times daily. For the first 5 days of your menstrual cycle. Patient not taking: Reported on 01/14/2015 10/03/14   Roe Coombsachelle A Denney, CNM    Family History No family history on file.  Social History Social History  Substance Use Topics  . Smoking status: Never Smoker  . Smokeless tobacco: Never Used  . Alcohol use 0.0 oz/week     Comment: social     Allergies   Penicillins and Tindamax [tinidazole]   Review of Systems Review of Systems  Skin: Positive for wound.  All other systems reviewed and are negative.    Physical Exam Updated Vital Signs BP 109/59 (BP Location: Right Arm)   Pulse 87   Temp 98.7 F (37.1 C) (Oral)   Resp 18   LMP 11/20/2015   SpO2 100%   Physical Exam  Constitutional: She appears well-developed and well-nourished.  HENT:  Head: Normocephalic.  Eyes: Conjunctivae are normal.  Neck: Neck supple.  Cardiovascular: Normal rate and regular rhythm.   Pulmonary/Chest: Effort normal and breath sounds normal.  Abdominal: Soft. Bowel sounds are normal.  Musculoskeletal: Normal range of motion. She exhibits tenderness.  Lymphadenopathy:    She has no cervical adenopathy.  Skin: Skin is  warm and dry. No erythema.  See attached photo of wound to posterior right calf.  Psychiatric: She has a normal mood and affect.  Nursing note and vitals reviewed.    ED Treatments / Results  Labs (all labs ordered are listed, but only abnormal results are displayed) Labs Reviewed - No data to display  EKG  EKG Interpretation None         Radiology No results found.  Procedures Procedures (including critical care time)  Medications Ordered in ED Medications - No data to display   Initial Impression / Assessment and Plan / ED Course  I have reviewed the triage  vital signs and the nursing notes.  Pertinent labs & imaging results that were available during my care of the patient were reviewed by me and considered in my medical decision making (see chart for details).  Clinical Course  Patient discussed with Dr. Blinda Leatherwood. Suspected spider bite with limited necrosis, currently healing via secondary intention. Care instructions provided. Return precautions discussed. Patient to follow-up with her PCP.    Final Clinical Impressions(s) / ED Diagnoses   Final diagnoses:  None    New Prescriptions New Prescriptions   No medications on file     Felicie Morn, NP 12/21/15 0234    Gilda Crease, MD 12/21/15 [phone]

## 2016-04-29 ENCOUNTER — Ambulatory Visit (HOSPITAL_COMMUNITY)
Admission: EM | Admit: 2016-04-29 | Discharge: 2016-04-29 | Disposition: A | Discharge status: Home / Self Care | Payer: 59 | Attending: Family Medicine | Admitting: Family Medicine

## 2016-04-29 ENCOUNTER — Encounter (HOSPITAL_COMMUNITY): Payer: Self-pay | Admitting: Family Medicine

## 2016-04-29 DIAGNOSIS — L089 Local infection of the skin and subcutaneous tissue, unspecified: Secondary | ICD-10-CM | POA: Diagnosis not present

## 2016-04-29 DIAGNOSIS — B9689 Other specified bacterial agents as the cause of diseases classified elsewhere: Secondary | ICD-10-CM | POA: Diagnosis not present

## 2016-04-29 MED ORDER — SULFAMETHOXAZOLE-TRIMETHOPRIM 800-160 MG PO TABS: 1.0000 | ORAL_TABLET | Freq: Two times a day (BID) | ORAL | 0 refills | Status: AC

## 2016-04-29 MED ORDER — MUPIROCIN CALCIUM 2 % NA OINT: TOPICAL_OINTMENT | NASAL | 1 refills | Status: DC

## 2016-04-29 NOTE — ED Triage Notes (Signed)
Pt here with multiple abscesses to right arm and sts other areas on body. Hx of MRSA.

## 2016-04-29 NOTE — Discharge Instructions (Signed)
Take the antibiotic as directed. If you have any itching or rash taken the medicine stop it immediately. Use the antibiotic ointment in your nose daily as directed. Make sure you wash with soap and water on a daily basis. Clean your working environment with antibacterial wipes.

## 2016-04-29 NOTE — ED Provider Notes (Signed)
CSN: [id number]     Arrival date & time 04/29/16  1521 History   First MD Initiated Contact with Patient 04/29/16 1709     Chief Complaint  Patient presents with  . 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 Since that time she has episodic breakouts of skin lesions similar to that of which she had at that time. Today she was told that she needed to see a PCP due to for annular lesions to the right upper extremity. These around tender minimally raised red lesions. A couple on have there is small pustules in the center. 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      Past Medical History:  Diagnosis Date  . MRSA (methicillin resistant staph aureus) culture positive    Past Surgical History:  Procedure Laterality Date  . CESAREAN SECTION     History reviewed. No pertinent family history. Social History  Substance Use Topics  . Smoking status: Never Smoker  . Smokeless tobacco: Never Used  . Alcohol use 0.0 oz/week     Comment: social   OB History    No data available     Review of Systems  Constitutional: Negative.   Respiratory: Negative.   Gastrointestinal: Negative.   Musculoskeletal: Negative.   Skin:       As per history of present illness. None of these small infected areas of the skin have drainage or bleeding.  Neurological: Negative.   All other systems reviewed and are negative.   Allergies  Penicillins and Tindamax [tinidazole]  Home Medications   Prior to Admission medications   Medication Sig Start Date End Date Taking? Authorizing Provider  FeAsp-B12-FA-C-DSS-SuccAc-Zn (FERIVA 21/7) 75-1 MG TABS Take 1 tablet by mouth daily. 10/03/14   Roe Coombsachelle A Denney, CNM  mupirocin nasal ointment (BACTROBAN) 2 % Apply in each nostril daily  04/29/16   Hayden Rasmussenavid Kateria Cutrona, NP  Prenat w/o A-FeCbGl-DSS-FA-DHA (CITRANATAL ASSURE) 35-1 & 300 MG tablet Take 1 tablet by mouth daily. 10/03/14   Rachelle A Denney, CNM  sulfamethoxazole-trimethoprim (BACTRIM DS,SEPTRA DS) 800-160 MG tablet Take 1 tablet by mouth 2 (two) times daily. 04/29/16 05/06/16  Hayden Rasmussenavid Kyrus Hyde, NP   Meds Ordered and Administered this Visit  Medications - No data to display  BP (!) 124/42   Pulse 66   Temp 98.5 F (36.9 C)   Resp 18   LMP 03/31/2016   SpO2 100%  No data found.   Physical Exam  Constitutional: She is oriented to person, place, and time. She appears well-developed and well-nourished. No distress.  HENT:  Head: Normocephalic.  Neck: Neck supple.  Cardiovascular: Normal rate.   Pulmonary/Chest: Effort normal.  Musculoskeletal: Normal range of motion.  Neurological: She is alert and oriented to person, place, and time.  Skin: Skin is warm and dry.   None of these small infected areas of the skin have drainage or bleeding.  These measure approximately 2-1/2 cm in diameter. There are 4 of them seen on the right upper extremity.   Psychiatric: She has a normal mood and affect.  Nursing note and vitals reviewed.   Urgent Care Course   Clinical Course     Procedures (including critical care time)  Labs Review Labs Reviewed - No data to display  Imaging Review No results found.   Visual Acuity Review  Right Eye Distance:   Left Eye Distance:   Bilateral Distance:    Right Eye Near:   Left Eye Near:    Bilateral Near:         MDM   1. Skin infection, bacterial    Take the antibiotic as directed. If you have any itching or rash taken the medicine stop it immediately. Use the antibiotic ointment in your nose daily as directed. Make sure you wash with soap and water on a daily basis. Clean your working environment with antibacterial wipes. Meds ordered this encounter  Medications  . sulfamethoxazole-trimethoprim (BACTRIM DS,SEPTRA DS)  800-160 MG tablet    Sig: Take 1 tablet by mouth 2 (two) times daily.    Dispense:  14 tablet    Refill:  0    Order Specific Question:   Supervising Provider    Answer:   Linna Hoff [phone]  . mupirocin nasal ointment (BACTROBAN) 2 %    Sig: Apply in each nostril daily    Dispense:  1 g    Refill:  1    Order Specific Question:   Supervising Provider    Answer:   Linna Hoff [[id number]]       Hayden Rasmussen, NP 04/29/16 1724

## 2016-05-16 ENCOUNTER — Encounter (HOSPITAL_COMMUNITY): Payer: Self-pay

## 2016-05-16 ENCOUNTER — Ambulatory Visit (HOSPITAL_COMMUNITY)
Admission: EM | Admit: 2016-05-16 | Discharge: 2016-05-16 | Disposition: A | Discharge status: Home / Self Care | Payer: 59 | Attending: Family Medicine | Admitting: Family Medicine

## 2016-05-16 DIAGNOSIS — L509 Urticaria, unspecified: Secondary | ICD-10-CM | POA: Diagnosis not present

## 2016-05-16 DIAGNOSIS — Z882 Allergy status to sulfonamides status: Secondary | ICD-10-CM

## 2016-05-16 MED ORDER — DOXYCYCLINE HYCLATE 100 MG PO TABS: 100.0000 mg | ORAL_TABLET | Freq: Two times a day (BID) | ORAL | 0 refills | Status: DC

## 2016-05-16 MED ORDER — PREDNISONE 20 MG PO TABS: ORAL_TABLET | ORAL | 0 refills | Status: DC

## 2016-05-16 NOTE — ED Triage Notes (Signed)
Pt having hives all over her body and now itching all over. She is taking bactrim for MRSA and hasn't finished the course. Said she has taking the medication before but didn't start taking it until the 13th of jan. No new laundry detergent or food.

## 2016-05-16 NOTE — ED Provider Notes (Signed)
[facility]    CSN: [id number] Arrival date & time: 05/16/16  1117     History   Chief Complaint Chief Complaint  Patient presents with  . Urticaria    HPI April Gregory is a 36 y.o. female.   This is a 36 year old woman who works at [employer]. Over the last year she's had recurrent episodes of MRSA. About 2 weeks ago patient was given a prescription for Bactrim which she's been taking until yesterday. She's had several days of itching but today itching got dramatically worse so she feels like tearing off her skin. She is noted whelps throughout her body. She's had no trouble breathing or throat swelling, but she says that her head feels like it's throbbing.      Past Medical History:  Diagnosis Date  . MRSA (methicillin resistant staph aureus) culture positive     Patient Active Problem List   Diagnosis Date Noted  . 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 CESAREAN SECTION      OB History    No data available       Home Medications    Prior to Admission medications   Medication Sig Start Date End Date Taking? Authorizing Provider  doxycycline (VIBRA-TABS) 100 MG tablet Take 1 tablet (100 mg total) by mouth 2 (two) times daily. 05/16/16   Elvina SidleKurt Andon Villard, MD  FeAsp-B12-FA-C-DSS-SuccAc-Zn (FERIVA 21/7) 75-1 MG TABS Take 1 tablet by mouth daily. 10/03/14   Roe Coombsachelle A Denney, CNM  mupirocin nasal ointment (BACTROBAN) 2 % Apply in each nostril daily 04/29/16   Hayden Rasmussenavid Mabe, NP  predniSONE (DELTASONE) 20 MG tablet Two daily with food 05/16/16   Elvina SidleKurt Emanuel Dowson, MD  Prenat w/o A-FeCbGl-DSS-FA-DHA (CITRANATAL ASSURE) 35-1 & 300 MG tablet Take 1 tablet by mouth daily. 10/03/14   Roe Coombsachelle A Denney, CNM    Family History No family history on file.  Social History Social History  Substance Use Topics  . Smoking status: Never Smoker  . Smokeless tobacco: Never Used  . Alcohol use 0.0 oz/week     Comment: social       Allergies   Penicillins and Tindamax [tinidazole]   Review of Systems Review of Systems  Constitutional: Negative.   HENT: Negative.   Respiratory: Negative.   Cardiovascular: Negative.   Skin: Positive for rash.  Neurological: Positive for headaches.     Physical Exam Triage Vital Signs ED Triage Vitals  Enc Vitals Group     BP 05/16/16 1207 122/69     Pulse Rate 05/16/16 1207 82     Resp 05/16/16 1207 20     Temp 05/16/16 1207 97.8 F (36.6 C)     Temp Source 05/16/16 1207 Oral     SpO2 05/16/16 1207 99 %     Weight --      Height --      Head Circumference --      Peak Flow --      Pain Score 05/16/16 1209 8     Pain Loc --      Pain Edu? --      Excl. in GC? --    No data found.   Updated Vital Signs BP 122/69 (BP Location: Left Arm)   Pulse 82   Temp 97.8 F (36.6 C) (Oral)   Resp 20   LMP 05/07/2016 (Exact Date)   SpO2 99%    Physical Exam  Constitutional: She is oriented to  person, place, and time. She appears well-developed and well-nourished.  HENT:  Head: Normocephalic.  Right Ear: External ear normal.  Left Ear: External ear normal.  Mouth/Throat: Oropharynx is clear and moist.  Eyes: Conjunctivae are normal. Pupils are equal, round, and reactive to light.  Neck: Normal range of motion. Neck supple.  Pulmonary/Chest: Effort normal.  Musculoskeletal: Normal range of motion.  Neurological: She is alert and oriented to person, place, and time.  Skin: Skin is dry. Rash noted. There is erythema.  Nursing note and vitals reviewed.    UC Treatments / Results  Labs (all labs ordered are listed, but only abnormal results are displayed) Labs Reviewed - No data to display  EKG  EKG Interpretation None       Radiology No results found.  Procedures Procedures (including critical care time)  Medications Ordered in UC Medications - No data to display   Initial Impression / Assessment and Plan / UC Course  I have reviewed the  triage vital signs and the nursing notes.  Pertinent labs & imaging results that were available during my care of the patient were reviewed by me and considered in my medical decision making (see chart for details).     Final Clinical Impressions(s) / UC Diagnoses   Final diagnoses:  Urticaria  Allergy to sulfa drugs    New Prescriptions New Prescriptions   DOXYCYCLINE (VIBRA-TABS) 100 MG TABLET    Take 1 tablet (100 mg total) by mouth 2 (two) times daily.   PREDNISONE (DELTASONE) 20 MG TABLET    Two daily with food     Elvina Sidle, MD 05/16/16 1241

## 2016-05-20 ENCOUNTER — Emergency Department (HOSPITAL_COMMUNITY): Admission: EM | Admit: 2016-05-20 | Discharge: 2016-05-20 | Discharge status: Against Medical Advice | Payer: 59

## 2016-08-18 LAB — HM HIV SCREENING LAB: HM HIV SCREENING: NEGATIVE

## 2016-08-18 LAB — HM HEPATITIS C SCREENING LAB: HM Hepatitis Screen: NEGATIVE

## 2016-08-19 LAB — HM PAP SMEAR: HM Pap smear: NEGATIVE

## 2016-09-14 ENCOUNTER — Ambulatory Visit (HOSPITAL_COMMUNITY)
Admission: EM | Admit: 2016-09-14 | Discharge: 2016-09-14 | Disposition: A | Discharge status: Home / Self Care | Payer: 59 | Attending: Family Medicine | Admitting: Family Medicine

## 2016-09-14 ENCOUNTER — Encounter (HOSPITAL_COMMUNITY): Payer: Self-pay | Admitting: Emergency Medicine

## 2016-09-14 DIAGNOSIS — L509 Urticaria, unspecified: Secondary | ICD-10-CM

## 2016-09-14 MED ORDER — PREDNISONE 20 MG PO TABS: ORAL_TABLET | ORAL | 0 refills | Status: DC

## 2016-09-14 MED ORDER — METHYLPREDNISOLONE ACETATE 80 MG/ML IJ SUSP
80.0000 mg | Freq: Once | INTRAMUSCULAR | Status: AC
  Administered 2016-09-14: 80 mg via INTRAMUSCULAR

## 2016-09-14 MED ORDER — METHYLPREDNISOLONE ACETATE 80 MG/ML IJ SUSP
INTRAMUSCULAR | Status: AC
  Filled 2016-09-14: qty 1

## 2016-09-14 NOTE — ED Triage Notes (Addendum)
[facility] a [facility] all over her body x 1 day. The patient did report starting Flagyl recently after a colposcopy procedure.

## 2016-09-14 NOTE — ED Provider Notes (Signed)
[facility]    CSN: [id number] Arrival date & time: 09/14/16  1758     History   Chief Complaint Chief Complaint  Patient presents with  . Urticaria    HPI April Gregory is a 36 y.o. female.   [facility] a [facility] of hives all over her body x 1 day. The patient did report starting Flagyl recently after a colposcopy procedure. She's never had a problem like this before.      Past Medical History:  Diagnosis Date  . MRSA (methicillin resistant staph aureus) culture positive     Patient Active Problem List   Diagnosis Date Noted  . 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 CESAREAN SECTION    . COLPOSCOPY      OB History    No data available       Home Medications    Prior to Admission medications   Medication Sig Start Date End Date Taking? Authorizing Provider  metroNIDAZOLE (FLAGYL) 250 MG tablet Take 250 mg by mouth 3 (three) times daily.   Yes [provider]  predniSONE (DELTASONE) 20 MG tablet Two daily with food 09/14/16   Elvina SidleLauenstein, Georgie Haque, MD    Family History History reviewed. No pertinent family history.  Social History Social History  Substance Use Topics  . Smoking status: Never Smoker  . Smokeless tobacco: Never Used  . Alcohol use 0.0 oz/week     Comment: social     Allergies   Penicillins and Tindamax [tinidazole]   Review of Systems Review of Systems  Constitutional: Negative.   Skin: Positive for rash.  All other systems reviewed and are negative.    Physical Exam Triage Vital Signs ED Triage Vitals  Enc Vitals Group     BP 09/14/16 1819 (!) 155/87     Pulse Rate 09/14/16 1819 85     Resp 09/14/16 1819 18     Temp 09/14/16 1819 98.1 F (36.7 C)     Temp Source 09/14/16 1819 Oral     SpO2 09/14/16 1819 98 %     Weight --      Height --      Head Circumference --      Peak Flow --      Pain Score 09/14/16 1818 0       Pain Loc --      Pain Edu? --      Excl. in GC? --    No data found.   Updated Vital Signs BP (!) 155/87 (BP Location: Right Arm)   Pulse 85   Temp 98.1 F (36.7 C) (Oral)   Resp 18   SpO2 98%    Physical Exam  Constitutional: She is oriented to person, place, and time. She appears well-developed and well-nourished.  HENT:  Right Ear: External ear normal.  Left Ear: External ear normal.  Mouth/Throat: Oropharynx is clear and moist.  Eyes: Conjunctivae and EOM are normal. Pupils are equal, round, and reactive to light.  Neck: Normal range of motion. Thyromegaly present.  Cardiovascular: Normal rate, regular rhythm and normal heart sounds.   Pulmonary/Chest: Effort normal and breath sounds normal.  Musculoskeletal: Normal range of motion.  Neurological: She is alert and oriented to person, place, and time.  Skin: Skin is warm and dry.  Patient has hives throughout her body.  Nursing note and vitals reviewed.    UC Treatments / Results  Labs (all labs ordered are listed, but only abnormal results are displayed) Labs Reviewed - No data to display  EKG  EKG Interpretation None       Radiology No results found.  Procedures Procedures (including critical care time)  Medications Ordered in UC Medications  methylPREDNISolone acetate (DEPO-MEDROL) injection 80 mg (not administered)     Initial Impression / Assessment and Plan / UC Course  I have reviewed the triage vital signs and the nursing notes.  Pertinent labs & imaging results that were available during my care of the patient were reviewed by me and considered in my medical decision making (see chart for details).     Final Clinical Impressions(s) / UC Diagnoses   Final diagnoses:  Hives    New Prescriptions New Prescriptions   PREDNISONE (DELTASONE) 20 MG TABLET    Two daily with food     Elvina Sidle, MD 09/14/16 1921

## 2016-12-16 ENCOUNTER — Encounter (HOSPITAL_COMMUNITY): Payer: Self-pay | Admitting: Emergency Medicine

## 2016-12-16 ENCOUNTER — Emergency Department (HOSPITAL_COMMUNITY)
Admission: EM | Admit: 2016-12-16 | Discharge: 2016-12-16 | Disposition: A | Discharge status: Home / Self Care | Payer: 59 | Attending: Emergency Medicine | Admitting: Emergency Medicine

## 2016-12-16 DIAGNOSIS — R1030 Lower abdominal pain, unspecified: Secondary | ICD-10-CM | POA: Insufficient documentation

## 2016-12-16 DIAGNOSIS — R1110 Vomiting, unspecified: Secondary | ICD-10-CM | POA: Insufficient documentation

## 2016-12-16 DIAGNOSIS — R300 Dysuria: Secondary | ICD-10-CM | POA: Insufficient documentation

## 2016-12-16 DIAGNOSIS — Z5321 Procedure and treatment not carried out due to patient leaving prior to being seen by health care provider: Secondary | ICD-10-CM | POA: Insufficient documentation

## 2016-12-16 LAB — COMPREHENSIVE METABOLIC PANEL
ALT: 17 U/L (ref 14–54)
ANION GAP: 8 (ref 5–15)
AST: 21 U/L (ref 15–41)
Albumin: 3.3 g/dL — ABNORMAL LOW (ref 3.5–5.0)
Alkaline Phosphatase: 64 U/L (ref 38–126)
BILIRUBIN TOTAL: 0.3 mg/dL (ref 0.3–1.2)
BUN: <5 mg/dL — ABNORMAL LOW (ref 6–20)
CO2: 25 mmol/L (ref 22–32)
Calcium: 8.7 mg/dL — ABNORMAL LOW (ref 8.9–10.3)
Chloride: 104 mmol/L (ref 101–111)
Creatinine, Ser: 0.87 mg/dL (ref 0.44–1.00)
GFR calc Af Amer: >60 mL/min (ref >60)
GFR calc non Af Amer: >60 mL/min (ref >60)
Glucose, Bld: 106 mg/dL — ABNORMAL HIGH (ref 65–99)
POTASSIUM: 2.8 mmol/L — AB (ref 3.5–5.1)
Sodium: 137 mmol/L (ref 135–145)
TOTAL PROTEIN: 7.4 g/dL (ref 6.5–8.1)

## 2016-12-16 LAB — URINALYSIS, ROUTINE W REFLEX MICROSCOPIC
Bilirubin Urine: NEGATIVE
Glucose, UA: NEGATIVE mg/dL
HGB URINE DIPSTICK: NEGATIVE
Ketones, ur: NEGATIVE mg/dL
Leukocytes, UA: NEGATIVE
NITRITE: POSITIVE — AB
PROTEIN: 30 mg/dL — AB
Specific Gravity, Urine: 1.02 (ref 1.005–1.030)
pH: 6 (ref 5.0–8.0)

## 2016-12-16 LAB — CBC
HEMATOCRIT: 35.5 % — AB (ref 36.0–46.0)
HEMOGLOBIN: 11 g/dL — AB (ref 12.0–15.0)
MCH: 22.6 pg — ABNORMAL LOW (ref 26.0–34.0)
MCHC: 31 g/dL (ref 30.0–36.0)
MCV: 72.9 fL — ABNORMAL LOW (ref 78.0–100.0)
Platelets: 302 10*3/uL (ref 150–400)
RBC: 4.87 MIL/uL (ref 3.87–5.11)
RDW: 18.1 % — ABNORMAL HIGH (ref 11.5–15.5)
WBC: 5.3 10*3/uL (ref 4.0–10.5)

## 2016-12-16 LAB — LIPASE, BLOOD: LIPASE: 23 U/L (ref 11–51)

## 2016-12-16 LAB — I-STAT BETA HCG BLOOD, ED (MC, WL, AP ONLY): I-stat hCG, quantitative: 1055.3 m[IU]/mL — ABNORMAL HIGH (ref <5)

## 2016-12-16 NOTE — ED Triage Notes (Signed)
Patient reports bilateral lower abdominal pain with occasional emesis onset this week , mild dysuria , denies diarrhea or fever .

## 2016-12-16 NOTE — ED Notes (Signed)
Pt informed tech first that she is leaving, no longer willing to wait to be seen. Did not wait for nurse first to intervene. LWBS.

## 2016-12-26 ENCOUNTER — Encounter (HOSPITAL_COMMUNITY): Payer: Self-pay | Admitting: *Deleted

## 2016-12-26 ENCOUNTER — Inpatient Hospital Stay (HOSPITAL_COMMUNITY)
Admission: AD | Admit: 2016-12-26 | Discharge: 2016-12-27 | Disposition: A | Discharge status: Home / Self Care | Payer: 59 | Source: Ambulatory Visit | Attending: Obstetrics and Gynecology | Admitting: Obstetrics and Gynecology

## 2016-12-26 DIAGNOSIS — R102 Pelvic and perineal pain: Secondary | ICD-10-CM | POA: Insufficient documentation

## 2016-12-26 DIAGNOSIS — Z880 Allergy status to penicillin: Secondary | ICD-10-CM | POA: Diagnosis not present

## 2016-12-26 DIAGNOSIS — Z3490 Encounter for supervision of normal pregnancy, unspecified, unspecified trimester: Secondary | ICD-10-CM

## 2016-12-26 DIAGNOSIS — O26891 Other specified pregnancy related conditions, first trimester: Secondary | ICD-10-CM | POA: Insufficient documentation

## 2016-12-26 DIAGNOSIS — Z3A01 Less than 8 weeks gestation of pregnancy: Secondary | ICD-10-CM | POA: Diagnosis not present

## 2016-12-26 NOTE — MAU Note (Signed)
Urine in the lab  

## 2016-12-26 NOTE — MAU Note (Signed)
Pt reports to MAU c/o abdominal cramping that started around 2200. Pt reports vaginal bleeding that she saw when she was wiping. Pt reports no further complaints at this time.

## 2016-12-27 ENCOUNTER — Inpatient Hospital Stay (HOSPITAL_COMMUNITY): Payer: 59

## 2016-12-27 DIAGNOSIS — Z3A01 Less than 8 weeks gestation of pregnancy: Secondary | ICD-10-CM

## 2016-12-27 DIAGNOSIS — O26891 Other specified pregnancy related conditions, first trimester: Secondary | ICD-10-CM

## 2016-12-27 DIAGNOSIS — R102 Pelvic and perineal pain: Secondary | ICD-10-CM

## 2016-12-27 LAB — WET PREP, GENITAL
SPERM: NONE SEEN
TRICH WET PREP: NONE SEEN
WBC, Wet Prep HPF POC: NONE SEEN
Yeast Wet Prep HPF POC: NONE SEEN

## 2016-12-27 LAB — URINALYSIS, ROUTINE W REFLEX MICROSCOPIC
Bilirubin Urine: NEGATIVE
GLUCOSE, UA: NEGATIVE mg/dL
KETONES UR: NEGATIVE mg/dL
LEUKOCYTES UA: NEGATIVE
NITRITE: NEGATIVE
PH: 6 (ref 5.0–8.0)
Protein, ur: 30 mg/dL — AB
Specific Gravity, Urine: 1.021 (ref 1.005–1.030)

## 2016-12-27 LAB — RPR: RPR Ser Ql: NONREACTIVE

## 2016-12-27 LAB — CBC
HCT: 31.2 % — ABNORMAL LOW (ref 36.0–46.0)
HEMOGLOBIN: 9.9 g/dL — AB (ref 12.0–15.0)
MCH: 22.8 pg — AB (ref 26.0–34.0)
MCHC: 31.7 g/dL (ref 30.0–36.0)
MCV: 71.9 fL — ABNORMAL LOW (ref 78.0–100.0)
Platelets: 292 10*3/uL (ref 150–400)
RBC: 4.34 MIL/uL (ref 3.87–5.11)
RDW: 18 % — ABNORMAL HIGH (ref 11.5–15.5)
WBC: 7.3 10*3/uL (ref 4.0–10.5)

## 2016-12-27 LAB — HIV ANTIBODY (ROUTINE TESTING W REFLEX): HIV Screen 4th Generation wRfx: NONREACTIVE

## 2016-12-27 LAB — HCG, QUANTITATIVE, PREGNANCY: HCG, BETA CHAIN, QUANT, S: 9491 m[IU]/mL — AB (ref <5)

## 2016-12-27 NOTE — Discharge Instructions (Signed)
Safe Medications in Pregnancy  ° °Acne: °Benzoyl Peroxide °Salicylic Acid ° °Backache/Headache: °Tylenol: 2 regular strength every 4 hours OR °             2 Extra strength every 6 hours ° °Colds/Coughs/Allergies: °Benadryl (alcohol free) 25 mg every 6 hours as needed °Breath right strips °Claritin °Cepacol throat lozenges °Chloraseptic throat spray °Cold-Eeze- up to three times per day °Cough drops, alcohol free °Flonase (by prescription only) °Guaifenesin °Mucinex °Robitussin DM (plain only, alcohol free) °Saline nasal spray/drops °Sudafed (pseudoephedrine) & Actifed ** use only after [redacted] weeks gestation and if you do not have high blood pressure °Tylenol °Vicks Vaporub °Zinc lozenges °Zyrtec  ° °Constipation: °Colace °Ducolax suppositories °Fleet enema °Glycerin suppositories °Metamucil °Milk of magnesia °Miralax °Senokot °Smooth move tea ° °Diarrhea: °Kaopectate °Imodium A-D ° °*NO pepto Bismol ° °Hemorrhoids: °Anusol °Anusol HC °Preparation H °Tucks ° °Indigestion: °Tums °Maalox °Mylanta °Zantac  °Pepcid ° °Insomnia: °Benadryl (alcohol free) 25mg every 6 hours as needed °Tylenol PM °Unisom, no Gelcaps ° °Leg Cramps: °Tums °MagGel ° °Nausea/Vomiting:  °Bonine °Dramamine °Emetrol °Ginger extract °Sea bands °Meclizine  °Nausea medication to take during pregnancy:  °Unisom (doxylamine succinate 25 mg tablets) Take one tablet daily at bedtime. If symptoms are not adequately controlled, the dose can be increased to a maximum recommended dose of two tablets daily (1/2 tablet in the morning, 1/2 tablet mid-afternoon and one at bedtime). °Vitamin B6 100mg tablets. Take one tablet twice a day (up to 200 mg per day). ° °Skin Rashes: °Aveeno products °Benadryl cream or 25mg every 6 hours as needed °Calamine Lotion °1% cortisone cream ° °Yeast infection: °Gyne-lotrimin 7 °Monistat 7 ° ° °**If taking multiple medications, please check labels to avoid duplicating the same active ingredients °**take medication as directed on  the label °** Do not exceed 4000 mg of tylenol in 24 hours °**Do not take medications that contain aspirin or ibuprofen ° ° ° ° °First Trimester of Pregnancy °The first trimester of pregnancy is from week 1 until the end of week 13 (months 1 through 3). A week after a sperm fertilizes an egg, the egg will implant on the wall of the uterus. This embryo will begin to develop into a baby. Genes from you and your partner will form the baby. The female genes will determine whether the baby will be a boy or a girl. At 6-8 weeks, the eyes and face will be formed, and the heartbeat can be seen on ultrasound. At the end of 12 weeks, all the baby's organs will be formed. °Now that you are pregnant, you will want to do everything you can to have a healthy baby. Two of the most important things are to get good prenatal care and to follow your health care provider's instructions. Prenatal care is all the medical care you receive before the baby's birth. This care will help prevent, find, and treat any problems during the pregnancy and childbirth. °Body changes during your first trimester °Your body goes through many changes during pregnancy. The changes vary from woman to woman. °· You may gain or lose a couple of pounds at first. °· You may feel sick to your stomach (nauseous) and you may throw up (vomit). If the vomiting is uncontrollable, call your health care provider. °· You may tire easily. °· You may develop headaches that can be relieved by medicines. All medicines should be approved by your health care provider. °· You may urinate more often. Painful urination may mean you have   a bladder infection. °· You may develop heartburn as a result of your pregnancy. °· You may develop constipation because certain hormones are causing the muscles that push stool through your intestines to slow down. °· You may develop hemorrhoids or swollen veins (varicose veins). °· Your breasts may begin to grow larger and become tender. Your  nipples may stick out more, and the tissue that surrounds them (areola) may become darker. °· Your gums may bleed and may be sensitive to brushing and flossing. °· Dark spots or blotches (chloasma, mask of pregnancy) may develop on your face. This will likely fade after the baby is born. °· Your menstrual periods will stop. °· You may have a loss of appetite. °· You may develop cravings for certain kinds of food. °· You may have changes in your emotions from day to day, such as being excited to be pregnant or being concerned that something may go wrong with the pregnancy and baby. °· You may have more vivid and strange dreams. °· You may have changes in your hair. These can include thickening of your hair, rapid growth, and changes in texture. Some women also have hair loss during or after pregnancy, or hair that feels dry or thin. Your hair will most likely return to normal after your baby is born. ° °What to expect at prenatal visits °During a routine prenatal visit: °· You will be weighed to make sure you and the baby are growing normally. °· Your blood pressure will be taken. °· Your abdomen will be measured to track your baby's growth. °· The fetal heartbeat will be listened to between weeks 10 and 14 of your pregnancy. °· Test results from any previous visits will be discussed. ° °Your health care provider may ask you: °· How you are feeling. °· If you are feeling the baby move. °· If you have had any abnormal symptoms, such as leaking fluid, bleeding, severe headaches, or abdominal cramping. °· If you are using any tobacco products, including cigarettes, chewing tobacco, and electronic cigarettes. °· If you have any questions. ° °Other tests that may be performed during your first trimester include: °· Blood tests to find your blood type and to check for the presence of any previous infections. The tests will also be used to check for low iron levels (anemia) and protein on red blood cells (Rh antibodies).  Depending on your risk factors, or if you previously had diabetes during pregnancy, you may have tests to check for high blood sugar that affects pregnant women (gestational diabetes). °· Urine tests to check for infections, diabetes, or protein in the urine. °· An ultrasound to confirm the proper growth and development of the baby. °· Fetal screens for spinal cord problems (spina bifida) and Down syndrome. °· HIV (human immunodeficiency virus) testing. Routine prenatal testing includes screening for HIV, unless you choose not to have this test. °· You may need other tests to make sure you and the baby are doing well. ° °Follow these instructions at home: °Medicines °· Follow your health care provider's instructions regarding medicine use. Specific medicines may be either safe or unsafe to take during pregnancy. °· Take a prenatal vitamin that contains at least 600 micrograms (mcg) of folic acid. °· If you develop constipation, try taking a stool softener if your health care provider approves. °Eating and drinking °· Eat a balanced diet that includes fresh fruits and vegetables, whole grains, good sources of protein such as meat, eggs, or tofu, and low-fat   dairy. Your health care provider will help you determine the amount of weight gain that is right for you. °· Avoid raw meat and uncooked cheese. These carry germs that can cause birth defects in the baby. °· Eating four or five small meals rather than three large meals a day may help relieve nausea and vomiting. If you start to feel nauseous, eating a few soda crackers can be helpful. Drinking liquids between meals, instead of during meals, also seems to help ease nausea and vomiting. °· Limit foods that are high in fat and processed sugars, such as fried and sweet foods. °· To prevent constipation: °? Eat foods that are high in fiber, such as fresh fruits and vegetables, whole grains, and beans. °? Drink enough fluid to keep your urine clear or pale  yellow. °Activity °· Exercise only as directed by your health care provider. Most women can continue their usual exercise routine during pregnancy. Try to exercise for 30 minutes at least 5 days a week. Exercising will help you: °? Control your weight. °? Stay in shape. °? Be prepared for labor and delivery. °· Experiencing pain or cramping in the lower abdomen or lower back is a good sign that you should stop exercising. Check with your health care provider before continuing with normal exercises. °· Try to avoid standing for long periods of time. Move your legs often if you must stand in one place for a long time. °· Avoid heavy lifting. °· Wear low-heeled shoes and practice good posture. °· You may continue to have sex unless your health care provider tells you not to. °Relieving pain and discomfort °· Wear a good support bra to relieve breast tenderness. °· Take warm sitz baths to soothe any pain or discomfort caused by hemorrhoids. Use hemorrhoid cream if your health care provider approves. °· Rest with your legs elevated if you have leg cramps or low back pain. °· If you develop varicose veins in your legs, wear support hose. Elevate your feet for 15 minutes, 3-4 times a day. Limit salt in your diet. °Prenatal care °· Schedule your prenatal visits by the twelfth week of pregnancy. They are usually scheduled monthly at first, then more often in the last 2 months before delivery. °· Write down your questions. Take them to your prenatal visits. °· Keep all your prenatal visits as told by your health care provider. This is important. °Safety °· Wear your seat belt at all times when driving. °· Make a list of emergency phone numbers, including numbers for family, friends, the hospital, and police and fire departments. °General instructions °· Ask your health care provider for a referral to a local prenatal education class. Begin classes no later than the beginning of month 6 of your pregnancy. °· Ask for help if  you have counseling or nutritional needs during pregnancy. Your health care provider can offer advice or refer you to specialists for help with various needs. °· Do not use hot tubs, steam rooms, or saunas. °· Do not douche or use tampons or scented sanitary pads. °· Do not cross your legs for long periods of time. °· Avoid cat litter boxes and soil used by cats. These carry germs that can cause birth defects in the baby and possibly loss of the fetus by miscarriage or stillbirth. °· Avoid all smoking, herbs, alcohol, and medicines not prescribed by your health care provider. Chemicals in these products affect the formation and growth of the baby. °· Do not use any products   that contain nicotine or tobacco, such as cigarettes and e-cigarettes. If you need help quitting, ask your health care provider. You may receive counseling support and other resources to help you quit. °· Schedule a dentist appointment. At home, brush your teeth with a soft toothbrush and be gentle when you floss. °Contact a health care provider if: °· You have dizziness. °· You have mild pelvic cramps, pelvic pressure, or nagging pain in the abdominal area. °· You have persistent nausea, vomiting, or diarrhea. °· You have a bad smelling vaginal discharge. °· You have pain when you urinate. °· You notice increased swelling in your face, hands, legs, or ankles. °· You are exposed to fifth disease or chickenpox. °· You are exposed to German measles (rubella) and have never had it. °Get help right away if: °· You have a fever. °· You are leaking fluid from your vagina. °· You have spotting or bleeding from your vagina. °· You have severe abdominal cramping or pain. °· You have rapid weight gain or loss. °· You vomit blood or material that looks like coffee grounds. °· You develop a severe headache. °· You have shortness of breath. °· You have any kind of trauma, such as from a fall or a car accident. °Summary °· The first trimester of pregnancy is  from week 1 until the end of week 13 (months 1 through 3). °· Your body goes through many changes during pregnancy. The changes vary from woman to woman. °· You will have routine prenatal visits. During those visits, your health care provider will examine you, discuss any test results you may have, and talk with you about how you are feeling. °This information is not intended to replace advice given to you by your health care provider. Make sure you discuss any questions you have with your health care provider. °Document Released: 04/01/2001 Document Revised: 03/19/2016 Document Reviewed: 03/19/2016 °Elsevier Interactive Patient Education © 2017 [employer] ° °

## 2016-12-27 NOTE — MAU Provider Note (Signed)
History     CSN: [id number]  Arrival date and time: 12/26/16 2230   First Provider Initiated Contact with Patient 12/27/16 0005      Chief Complaint  Patient presents with  . Pelvic Pain   Pelvic Pain  The patient's primary symptoms include pelvic pain and vaginal bleeding. The patient's pertinent negatives include no vaginal discharge. This is a new problem. The current episode started today (around 2200. ). The problem occurs constantly. The problem has been unchanged. Pain severity now: 7/10. The problem affects both sides. She is pregnant. Pertinent negatives include no chills, dysuria, fever, frequency, nausea, urgency or vomiting. Vaginal bleeding amount: pink spotting with wiping.  She has not been passing clots. She has not been passing tissue. Exacerbated by: Patient states that she had an argument, and was yelling. Pain started right afterward.  She has tried nothing for the symptoms. The treatment provided no relief. Sexual activity: intercourse in the last 498 hours.  She uses nothing for contraception. Her menstrual history has been regular (LMP 11/01/16 ).   Past Medical History:  Diagnosis Date  . MRSA (methicillin resistant staph aureus) culture positive     Past Surgical History:  Procedure Laterality Date  . CESAREAN SECTION    . COLPOSCOPY      Family History  Problem Relation Age of Onset  . Cancer Mother   . Hypertension Mother     Social History  Substance Use Topics  . Smoking status: Never Smoker  . Smokeless tobacco: Never Used  . Alcohol use No     Comment: social    Allergies:  Allergies  Allergen Reactions  . Penicillins Other (See Comments)    Has patient had a PCN reaction causing immediate rash, facial/tongue/throat swelling, SOB or lightheadedness with hypotension: No Has patient had a PCN reaction causing severe rash involving mucus membranes or skin necrosis: No Has patient had a PCN reaction that required hospitalization No Has patient  had a PCN reaction occurring within the last 10 years:yes...5-6 years ago If all of the above answers are "NO", then may proceed with Cephalosporin use.  Lucienne Capers. Tindamax [Tinidazole] Itching    Prescriptions Prior to Admission  Medication Sig Dispense Refill Last Dose  . Prenatal Vit-Fe Fumarate-FA (PRENATAL MULTIVITAMIN) TABS tablet Take 1 tablet by mouth daily at 12 noon.   12/26/2016 at Unknown time  . metroNIDAZOLE (FLAGYL) 250 MG tablet Take 250 mg by mouth 3 (three) times daily.   More than a month at Unknown time  . predniSONE (DELTASONE) 20 MG tablet Two daily with food 10 tablet 0 More than a month at Unknown time    Review of Systems  Constitutional: Negative for chills and fever.  Gastrointestinal: Negative for nausea and vomiting.  Genitourinary: Positive for pelvic pain and vaginal bleeding. Negative for dysuria, frequency, urgency and vaginal discharge.   Physical Exam   Blood pressure 139/85, temperature 98.3 F (36.8 C), temperature source Oral, resp. rate 17, height 5\' 11"  (1.803 m), weight (!) 310 lb (140.6 kg), last menstrual period 11/01/2016.  Physical Exam  Nursing note and vitals reviewed. Constitutional: She is oriented to person, place, and time. She appears well-developed and well-nourished. No distress.  HENT:  Head: Normocephalic.  Cardiovascular: Normal rate.   Respiratory: Effort normal.  GI: Soft. There is no tenderness. There is no rebound.  Neurological: She is alert and oriented to person, place, and time.  Skin: Skin is warm and dry.  Psychiatric: She has a normal mood and  affect.   Results for orders placed or performed during the hospital encounter of 12/26/16 (from the past 24 hour(s))  Urinalysis, Routine w reflex microscopic     Status: Abnormal   Collection Time: 12/26/16 11:35 PM  Result Value Ref Range   Color, Urine YELLOW YELLOW   APPearance HAZY (A) CLEAR   Specific Gravity, Urine 1.021 1.005 - 1.030   pH 6.0 5.0 - 8.0   Glucose, UA  NEGATIVE NEGATIVE mg/dL   Hgb urine dipstick SMALL (A) NEGATIVE   Bilirubin Urine NEGATIVE NEGATIVE   Ketones, ur NEGATIVE NEGATIVE mg/dL   Protein, ur 30 (A) NEGATIVE mg/dL   Nitrite NEGATIVE NEGATIVE   Leukocytes, UA NEGATIVE NEGATIVE   RBC / HPF 0-5 0 - 5 RBC/hpf   WBC, UA 0-5 0 - 5 WBC/hpf   Bacteria, UA RARE (A) NONE SEEN   Squamous Epithelial / LPF 6-30 (A) NONE SEEN   Mucus PRESENT   Wet prep, genital     Status: Abnormal   Collection Time: 12/27/16 12:17 AM  Result Value Ref Range   Yeast Wet Prep HPF POC NONE SEEN NONE SEEN   Trich, Wet Prep NONE SEEN NONE SEEN   Clue Cells Wet Prep HPF POC PRESENT (A) NONE SEEN   WBC, Wet Prep HPF POC NONE SEEN NONE SEEN   Sperm NONE SEEN   CBC     Status: Abnormal   Collection Time: 12/27/16 12:25 AM  Result Value Ref Range   WBC 7.3 4.0 - 10.5 K/uL   RBC 4.34 3.87 - 5.11 MIL/uL   Hemoglobin 9.9 (L) 12.0 - 15.0 g/dL   HCT 16.1 (L) 09.6 - 04.5 %   MCV 71.9 (L) 78.0 - 100.0 fL   MCH 22.8 (L) 26.0 - 34.0 pg   MCHC 31.7 30.0 - 36.0 g/dL   RDW 40.9 (H) 81.1 - 91.4 %   Platelets 292 150 - 400 K/uL   US Ob Comp Less 14 Wks  Result Date: 12/27/2016 CLINICAL DATA:  Pregnant patient in first-trimester pregnancy with pelvic pain. Vaginal bleeding. EXAM: OBSTETRIC <14 WK [place] AND TRANSVAGINAL OB US TECHNIQUE: Both transabdominal and transvaginal ultrasound examinations were performed for complete evaluation of the gestation as well as the maternal uterus, adnexal regions, and pelvic cul-de-sac. Transvaginal technique was performed to assess early pregnancy. COMPARISON:  None. FINDINGS: Intrauterine gestational sac: Single Yolk sac:  Visualized. Embryo:  Visualized. Cardiac Activity: Visualized. Heart Rate: 121  bpm CRL:  3  mm   5 w   6 d                  [place] EDC: 08/23/2017 Subchorionic hemorrhage:  None visualized. Maternal uterus/adnexae: There is a small amount of fluid in the cervical canal. Of right ovaries normal. There is a 1.9 cm simple cyst in  the left ovary which is otherwise normal. No pelvic free fluid or adnexal mass. Body habitus limit exam. IMPRESSION: Single live intrauterine pregnancy estimated gestational age [redacted] weeks 6 days for estimated date of delivery 08/23/2017. No subchorionic hemorrhage, however there is small amount of fluid in the cervical canal. Electronically Signed   By: Rubye Oaks M.D.   On: 12/27/2016 01:03   US Ob Transvaginal  Result Date: 12/27/2016 CLINICAL DATA:  Pregnant patient in first-trimester pregnancy with pelvic pain. Vaginal bleeding. EXAM: OBSTETRIC <14 WK [place] AND TRANSVAGINAL OB US TECHNIQUE: Both transabdominal and transvaginal ultrasound examinations were performed for complete evaluation of the gestation as well as the maternal  uterus, adnexal regions, and pelvic cul-de-sac. Transvaginal technique was performed to assess early pregnancy. COMPARISON:  None. FINDINGS: Intrauterine gestational sac: Single Yolk sac:  Visualized. Embryo:  Visualized. Cardiac Activity: Visualized. Heart Rate: 121  bpm CRL:  3  mm   5 w   6 d                  [place] EDC: 08/23/2017 Subchorionic hemorrhage:  None visualized. Maternal uterus/adnexae: There is a small amount of fluid in the cervical canal. Of right ovaries normal. There is a 1.9 cm simple cyst in the left ovary which is otherwise normal. No pelvic free fluid or adnexal mass. Body habitus limit exam. IMPRESSION: Single live intrauterine pregnancy estimated gestational age [redacted] weeks 6 days for estimated date of delivery 08/23/2017. No subchorionic hemorrhage, however there is small amount of fluid in the cervical canal. Electronically Signed   By: Rubye Oaks M.D.   On: 12/27/2016 01:03   MAU Course  Procedures  MDM   Assessment and Plan   1. Intrauterine pregnancy   2. [redacted] weeks gestation of pregnancy   3. Pelvic pain in pregnancy, antepartum, first trimester    DC home Comfort measures reviewed  1st Trimester precautions  Urine culture pending  RX:  none  Return to MAU as needed FU with OB as planned  Follow-up Information    [facility] Follow up.   Specialty:  Obstetrics and Gynecology Contact information: [address], [address] [place] [address] [phone]          Thressa Sheller 12/27/2016, 12:07 AM

## 2016-12-28 LAB — CULTURE, OB URINE: Culture: >=100000 — AB

## 2016-12-29 LAB — GC/CHLAMYDIA PROBE AMP (~~LOC~~) NOT AT ARMC
Chlamydia: NEGATIVE
NEISSERIA GONORRHEA: NEGATIVE

## 2017-01-21 ENCOUNTER — Encounter: Payer: Self-pay | Admitting: Certified Nurse Midwife

## 2017-01-21 ENCOUNTER — Encounter: Payer: Self-pay | Admitting: *Deleted

## 2017-01-21 ENCOUNTER — Other Ambulatory Visit: Payer: Self-pay | Admitting: Certified Nurse Midwife

## 2017-01-21 ENCOUNTER — Ambulatory Visit (HOSPITAL_COMMUNITY)
Admission: RE | Admit: 2017-01-21 | Discharge: 2017-01-21 | Disposition: A | Discharge status: Home / Self Care | Payer: 59 | Source: Ambulatory Visit | Attending: Certified Nurse Midwife | Admitting: Certified Nurse Midwife

## 2017-01-21 ENCOUNTER — Ambulatory Visit (INDEPENDENT_AMBULATORY_CARE_PROVIDER_SITE_OTHER): Payer: 59 | Admitting: Certified Nurse Midwife

## 2017-01-21 VITALS — BP 125/85 | HR 85 | Temp 97.9°F | Wt 253.7 lb

## 2017-01-21 DIAGNOSIS — Z3490 Encounter for supervision of normal pregnancy, unspecified, unspecified trimester: Secondary | ICD-10-CM

## 2017-01-21 DIAGNOSIS — N760 Acute vaginitis: Secondary | ICD-10-CM

## 2017-01-21 DIAGNOSIS — O209 Hemorrhage in early pregnancy, unspecified: Secondary | ICD-10-CM | POA: Diagnosis present

## 2017-01-21 DIAGNOSIS — O219 Vomiting of pregnancy, unspecified: Secondary | ICD-10-CM

## 2017-01-21 DIAGNOSIS — O208 Other hemorrhage in early pregnancy: Principal | ICD-10-CM

## 2017-01-21 DIAGNOSIS — Z8759 Personal history of other complications of pregnancy, childbirth and the puerperium: Secondary | ICD-10-CM

## 2017-01-21 DIAGNOSIS — Z3483 Encounter for supervision of other normal pregnancy, third trimester: Secondary | ICD-10-CM

## 2017-01-21 DIAGNOSIS — B9689 Other specified bacterial agents as the cause of diseases classified elsewhere: Secondary | ICD-10-CM

## 2017-01-21 DIAGNOSIS — Z3A10 10 weeks gestation of pregnancy: Secondary | ICD-10-CM | POA: Diagnosis not present

## 2017-01-21 DIAGNOSIS — Z3481 Encounter for supervision of other normal pregnancy, first trimester: Secondary | ICD-10-CM

## 2017-01-21 DIAGNOSIS — O0990 Supervision of high risk pregnancy, unspecified, unspecified trimester: Secondary | ICD-10-CM | POA: Insufficient documentation

## 2017-01-21 MED ORDER — DOXYLAMINE-PYRIDOXINE 10-10 MG PO TBEC: DELAYED_RELEASE_TABLET | ORAL | 4 refills | Status: DC

## 2017-01-21 MED ORDER — CITRANATAL HARMONY 27-1-260 MG PO CAPS: 1.0000 | ORAL_CAPSULE | Freq: Every day | ORAL | 12 refills | Status: DC

## 2017-01-21 MED ORDER — SECNIDAZOLE 2 G PO PACK: 1.0000 | PACK | Freq: Once | ORAL | 0 refills | Status: AC

## 2017-01-21 MED ORDER — ASPIRIN 81 MG PO CHEW: 81.0000 mg | CHEWABLE_TABLET | Freq: Every day | ORAL | 12 refills | Status: DC

## 2017-01-21 NOTE — Progress Notes (Signed)
New OB. Had spotting on Sunday when she wiped also lower abdominal pain on left side.  Declined FLU vaccine.

## 2017-01-21 NOTE — Progress Notes (Signed)
Subjective:    April Gregory is being seen today for her first obstetrical visit.  This is a planned pregnancy. She is at [redacted]w[redacted]d gestation. Her obstetrical history is significant for obesity and pre-eclampsia with last pregnancy. Relationship with FOB: significant other, living together. Patient does intend to breast feed. Pregnancy history fully reviewed.  Reports all day nausea without ability to have emesis.  Reports spotting since September.  Hx of hidradenitis, has not seen dermatology. Reports hives with Flagyl in April.    The information documented in the HPI was reviewed and verified.  Menstrual History: OB History    Gravida Para Term Preterm AB Living   SAB TAB Ectopic Multiple Live Births           3       Patient's last menstrual period was 11/01/2016 (approximate).    Past Medical History:  Diagnosis Date  . Anxiety   . MRSA (methicillin resistant staph aureus) culture positive   . Pregnancy induced hypertension   . Spinal headache   . Vaginal Pap smear, abnormal     Past Surgical History:  Procedure Laterality Date  . CESAREAN SECTION    . COLPOSCOPY       (Not in a hospital admission) Allergies  Allergen Reactions  . Flagyl [Metronidazole] Hives  . Penicillins Other (See Comments)    Has patient had a PCN reaction causing immediate rash, facial/tongue/throat swelling, SOB or lightheadedness with hypotension: No Has patient had a PCN reaction causing severe rash involving mucus membranes or skin necrosis: No Has patient had a PCN reaction that required hospitalization No Has patient had a PCN reaction occurring within the last 10 years:yes...5-6 years ago If all of the above answers are "NO", then may proceed with Cephalosporin use.    Social History  Substance Use Topics  . Smoking status: Never Smoker  . Smokeless tobacco: Never Used  . Alcohol use No     Comment: social    Family History  Problem Relation Age of Onset  . Cancer  Mother   . Hypertension Mother   . Arthritis Mother   . Learning disabilities Son   . Arthritis Maternal Grandmother   . Cancer Maternal Grandmother      Review of Systems Constitutional: negative for weight loss Gastrointestinal: negative for vomiting, + nausea Genitourinary:negative for genital lesions and vaginal discharge and dysuria Musculoskeletal:negative for back pain Behavioral/Psych: negative for abusive relationship, depression, illegal drug usage and tobacco use    Objective:    BP 125/85   Pulse 85   Temp 97.9 F (36.6 C)   Wt 253 lb 11.2 oz (115.1 kg)   LMP 11/01/2016 (Approximate)   BMI 35.38 kg/m  General Appearance:    Alert, cooperative, no distress, appears stated age  Head:    Normocephalic, without obvious abnormality, atraumatic  Eyes:    PERRL, conjunctiva/corneas clear, EOM's intact, fundi    benign, both eyes  Ears:    Normal TM's and external ear canals, both ears  Nose:   Nares normal, septum midline, mucosa normal, no drainage    or sinus tenderness  Throat:   Lips, mucosa, and tongue normal; teeth and gums normal  Neck:   Supple, symmetrical, trachea midline, no adenopathy;    thyroid:  no enlargement/tenderness/nodules; no carotid   bruit or JVD  Back:     Symmetric, no curvature, ROM normal, no CVA tenderness  Lungs:  Clear to auscultation bilaterally, respirations unlabored  Chest Wall:    No tenderness or deformity   Heart:    Regular rate and rhythm, S1 and S2 normal, no murmur, rub   or gallop  Breast Exam:    No tenderness, masses, or nipple abnormality  Abdomen:     Soft, non-tender, bowel sounds active all four quadrants,    no masses, no organomegaly; obese  Genitalia:    Normal female without lesion, discharge or tenderness  Extremities:   Extremities normal, atraumatic, no cyanosis or edema  Pulses:   2+ and symmetric all extremities  Skin:   Skin color, texture, turgor normal, no rashes or lesions  Lymph nodes:   Cervical,  supraclavicular, and axillary nodes normal  Neurologic:   CNII-XII intact, normal strength, sensation and reflexes    throughout     Cervix: long, thick, closed, and posterior.      Lab Review Urine pregnancy test Labs reviewed yes Radiologic studies reviewed yes  Assessment & Plan    Pregnancy at [redacted]w[redacted]d weeks    1. Encounter for supervision of normal pregnancy, antepartum, unspecified gravidity      Letter written for work for increased bathroom breaks.  - Enroll Patient in [facility] - Obstetric Panel, Including HIV - Cystic Fibrosis Mutation 97 - Cytology - PAP - Varicella zoster antibody, IgG - Hemoglobinopathy evaluation - Hemoglobin A1c - Culture, OB Urine  2. History of pregnancy induced hypertension     Start OTC baby ASA - aspirin 81 MG chewable tablet; Chew 1 tablet (81 mg total) by mouth daily.  Dispense: 30 tablet; Refill: 12  3. Vaginal bleeding affecting early pregnancy      Unknown origin.  - US OB Comp Less 14 Wks; Future - US OB Transvaginal; Future  4. Nausea and vomiting in pregnancy      - Doxylamine-Pyridoxine (DICLEGIS) 10-10 MG TBEC; Take 1 tablet with breakfast and lunch.  Take 2 tablets at bedtime.  Dispense: 100 tablet; Refill: 4  5. BV (bacterial vaginosis)     - Secnidazole (SOLOSEC) 2 g PACK; Take 1 Package by mouth once.  Dispense: 1 each; Refill: 0     Prenatal vitamins.  Counseling provided regarding continued use of seat belts, cessation of alcohol consumption, smoking or use of illicit drugs; infection precautions i.e., influenza/TDAP immunizations, toxoplasmosis,CMV, parvovirus, listeria and varicella; workplace safety, exercise during pregnancy; routine dental care, safe medications, sexual activity, hot tubs, saunas, pools, travel, caffeine use, fish and methlymercury, potential toxins, hair treatments, varicose veins Weight gain recommendations per IOM guidelines reviewed: underweight/BMI< 18.5--> gain 28 - 40 lbs; normal weight/BMI  18.5 - 24.9--> gain 25 - 35 lbs; overweight/BMI 25 - 29.9--> gain 15 - 25 lbs; obese/BMI >30->gain  11 - 20 lbs Problem list reviewed and updated. FIRST/CF mutation testing/NIPT/QUAD SCREEN/fragile X/Ashkenazi Jewish population testing/Spinal muscular atrophy discussed: requested. Role of ultrasound in pregnancy discussed; fetal survey: requested. Amniocentesis discussed: not indicated.  Meds ordered this encounter  Medications  . aspirin 81 MG chewable tablet    Sig: Chew 1 tablet (81 mg total) by mouth daily.    Dispense:  30 tablet    Refill:  12  . Doxylamine-Pyridoxine (DICLEGIS) 10-10 MG TBEC    Sig: Take 1 tablet with breakfast and lunch.  Take 2 tablets at bedtime.    Dispense:  100 tablet    Refill:  4  . Secnidazole (SOLOSEC) 2 g PACK    Sig: Take 1 Package by mouth once.  Dispense:  1 each    Refill:  0  . Prenat-FeFmCb-DSS-FA-DHA w/o A (CITRANATAL HARMONY) 27-1-260 MG CAPS    Sig: Take 1 tablet by mouth daily.    Dispense:  30 capsule    Refill:  12   Orders Placed This Encounter  Procedures  . Culture, OB Urine  . US OB Comp Less 14 Wks    Standing Status:   Future    Standing Expiration Date:   03/23/2018    Order Specific Question:   Reason for Exam (SYMPTOM  OR DIAGNOSIS REQUIRED)    Answer:   vaginal bleeding in early pregnancy    Order Specific Question:   Preferred imaging location?    Answer:   [facility]  . US OB Transvaginal    Standing Status:   Future    Standing Expiration Date:   03/23/2018    Order Specific Question:   Reason for Exam (SYMPTOM  OR DIAGNOSIS REQUIRED)    Answer:   vaginal bleeding in early pregnancy    Order Specific Question:   Preferred imaging location?    Answer:   [facility]  . Obstetric Panel, Including HIV  . Cystic Fibrosis Mutation 97  . Varicella zoster antibody, IgG  . Hemoglobinopathy evaluation  . Hemoglobin A1c    Follow up in 4 weeks. 50% of 45 min visit spent on counseling and coordination of  care.

## 2017-01-26 ENCOUNTER — Other Ambulatory Visit: Payer: Self-pay | Admitting: Certified Nurse Midwife

## 2017-01-26 DIAGNOSIS — O2341 Unspecified infection of urinary tract in pregnancy, first trimester: Secondary | ICD-10-CM

## 2017-01-26 LAB — CULTURE, OB URINE

## 2017-01-26 LAB — URINE CULTURE, OB REFLEX

## 2017-01-26 MED ORDER — NITROFURANTOIN MONOHYD MACRO 100 MG PO CAPS: 100.0000 mg | ORAL_CAPSULE | Freq: Two times a day (BID) | ORAL | 0 refills | Status: DC

## 2017-01-28 ENCOUNTER — Other Ambulatory Visit: Payer: Self-pay | Admitting: Certified Nurse Midwife

## 2017-01-28 DIAGNOSIS — O361990 Maternal care for other isoimmunization, unspecified trimester, not applicable or unspecified: Secondary | ICD-10-CM | POA: Insufficient documentation

## 2017-01-28 DIAGNOSIS — O361910 Maternal care for other isoimmunization, first trimester, not applicable or unspecified: Secondary | ICD-10-CM

## 2017-01-28 DIAGNOSIS — Z3480 Encounter for supervision of other normal pregnancy, unspecified trimester: Secondary | ICD-10-CM

## 2017-01-28 LAB — OBSTETRIC PANEL, INCLUDING HIV
BASOS ABS: 0 10*3/uL (ref 0.0–0.2)
Basos: 0 %
EOS (ABSOLUTE): 0.1 10*3/uL (ref 0.0–0.4)
Eos: 2 %
HEMATOCRIT: 34.7 % (ref 34.0–46.6)
HEP B S AG: NEGATIVE
HIV SCREEN 4TH GENERATION: NONREACTIVE
Hemoglobin: 11 g/dL — ABNORMAL LOW (ref 11.1–15.9)
IMMATURE GRANS (ABS): 0 10*3/uL (ref 0.0–0.1)
Immature Granulocytes: 0 %
LYMPHS: 34 %
Lymphocytes Absolute: 1.8 10*3/uL (ref 0.7–3.1)
MCH: 22.5 pg — AB (ref 26.6–33.0)
MCHC: 31.7 g/dL (ref 31.5–35.7)
MCV: 71 fL — AB (ref 79–97)
MONOCYTES: 4 %
Monocytes Absolute: 0.2 10*3/uL (ref 0.1–0.9)
NEUTROS PCT: 60 %
Neutrophils Absolute: 3.1 10*3/uL (ref 1.4–7.0)
PLATELETS: 296 10*3/uL (ref 150–379)
RBC: 4.89 x10E6/uL (ref 3.77–5.28)
RDW: 18 % — AB (ref 12.3–15.4)
RPR Ser Ql: NONREACTIVE
RUBELLA: 1.64 {index} (ref >0.99)
Rh Factor: POSITIVE
WBC: 5.2 10*3/uL (ref 3.4–10.8)

## 2017-01-28 LAB — HEMOGLOBINOPATHY EVALUATION
HEMOGLOBIN F QUANTITATION: 0 % (ref 0.0–2.0)
HGB C: 0 %
HGB S: 0 %
HGB VARIANT: 0 %
Hemoglobin A2 Quantitation: 2.3 % (ref 1.8–3.2)
Hgb A: 97.7 % (ref 96.4–98.8)

## 2017-01-28 LAB — HEMOGLOBIN A1C
Est. average glucose Bld gHb Est-mCnc: 103 mg/dL
HEMOGLOBIN A1C: 5.2 % (ref 4.8–5.6)

## 2017-01-28 LAB — AB SCR+ANTIBODY ID: ANTIBODY SCREEN: POSITIVE — AB

## 2017-01-28 LAB — VARICELLA ZOSTER ANTIBODY, IGG: VARICELLA: 2599 {index} (ref >165)

## 2017-01-29 ENCOUNTER — Encounter: Payer: Self-pay | Admitting: *Deleted

## 2017-01-29 ENCOUNTER — Encounter: Payer: Self-pay | Admitting: Certified Nurse Midwife

## 2017-01-29 ENCOUNTER — Other Ambulatory Visit: Payer: Self-pay | Admitting: Certified Nurse Midwife

## 2017-01-29 DIAGNOSIS — Z3480 Encounter for supervision of other normal pregnancy, unspecified trimester: Secondary | ICD-10-CM

## 2017-01-29 LAB — CYSTIC FIBROSIS MUTATION 97: GENE DIS ANAL CARRIER INTERP BLD/T-IMP: NOT DETECTED

## 2017-01-30 ENCOUNTER — Other Ambulatory Visit: Payer: Self-pay

## 2017-01-30 DIAGNOSIS — O219 Vomiting of pregnancy, unspecified: Secondary | ICD-10-CM

## 2017-01-30 MED ORDER — DOXYLAMINE-PYRIDOXINE 10-10 MG PO TBEC: DELAYED_RELEASE_TABLET | ORAL | 4 refills | Status: DC

## 2017-02-04 ENCOUNTER — Encounter: Payer: Self-pay | Admitting: Obstetrics and Gynecology

## 2017-02-04 DIAGNOSIS — O09299 Supervision of pregnancy with other poor reproductive or obstetric history, unspecified trimester: Secondary | ICD-10-CM | POA: Insufficient documentation

## 2017-02-18 ENCOUNTER — Ambulatory Visit (INDEPENDENT_AMBULATORY_CARE_PROVIDER_SITE_OTHER): Payer: 59 | Admitting: Certified Nurse Midwife

## 2017-02-18 VITALS — BP 118/74 | HR 75 | Wt 355.0 lb

## 2017-02-18 DIAGNOSIS — O09299 Supervision of pregnancy with other poor reproductive or obstetric history, unspecified trimester: Secondary | ICD-10-CM

## 2017-02-18 DIAGNOSIS — B9689 Other specified bacterial agents as the cause of diseases classified elsewhere: Secondary | ICD-10-CM

## 2017-02-18 DIAGNOSIS — Z3480 Encounter for supervision of other normal pregnancy, unspecified trimester: Secondary | ICD-10-CM

## 2017-02-18 DIAGNOSIS — O2341 Unspecified infection of urinary tract in pregnancy, first trimester: Secondary | ICD-10-CM

## 2017-02-18 DIAGNOSIS — O09291 Supervision of pregnancy with other poor reproductive or obstetric history, first trimester: Secondary | ICD-10-CM

## 2017-02-18 DIAGNOSIS — Z3481 Encounter for supervision of other normal pregnancy, first trimester: Secondary | ICD-10-CM

## 2017-02-18 DIAGNOSIS — N760 Acute vaginitis: Secondary | ICD-10-CM

## 2017-02-18 MED ORDER — CLINDAMYCIN PHOSPHATE 2 % VA CREA: 1.0000 | TOPICAL_CREAM | Freq: Every day | VAGINAL | 0 refills | Status: DC

## 2017-02-18 NOTE — Progress Notes (Signed)
   PRENATAL VISIT NOTE  Subjective:  April Gregory is a 36 y.o. [phone] at 262w3d being seen today for ongoing prenatal care.  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  Patient reports backache, no bleeding, no contractions, no cramping, no leaking and round ligament type abdominal pain.  Contractions: Irritability. Vag. Bleeding: None.  Movement: Present. Denies leaking of fluid.   The following portions of the patient's history were reviewed and updated as appropriate: allergies, current medications, past family history, past medical history, past social history, past surgical history and problem list. Problem list updated.  Objective:   Vitals:   02/18/17 1052  BP: 118/74  Pulse: 75  Weight: (!) 355 lb (161 kg)    Fetal Status: Fetal Heart Rate (bpm): 154; US   Movement: Present     General:  Alert, oriented and cooperative. Patient is in no acute distress.  Skin: Skin is warm and dry. No rash noted.   Cardiovascular: Normal heart rate noted  Respiratory: Normal respiratory effort, no problems with respiration noted  Abdomen: Soft, gravid, appropriate for gestational age.  Pain/Pressure: Present     Pelvic: Cervical exam deferred        Extremities: Normal range of motion.  Edema: None  Mental Status:  Normal mood and affect. Normal behavior. Normal judgment and thought content.   Assessment and Plan:  Pregnancy: G4P3003 at 1762w3d  1. Hx of preeclampsia, prior pregnancy, currently pregnant     Taking baby ASA  2. Supervision of other normal pregnancy, antepartum      Doing well - US MFM OB DETAIL +14 WK; Future  3. UTI in pregnancy, first trimester     TOC today -  Culture, OB Urine  4. BV (bacterial vaginosis)     - clindamycin (CLEOCIN) 2 % vaginal cream; Place 1 Applicatorful vaginally at bedtime.  Dispense: 40 g; Refill: 0  Preterm labor symptoms and general obstetric precautions including but not limited to vaginal bleeding, contractions, leaking of fluid and fetal movement were reviewed in detail with the patient. Please refer to After Visit Summary for other counseling recommendations.  Return in about 4 weeks (around 03/18/2017) for ROB.   April Gregory, CNM

## 2017-02-18 NOTE — Progress Notes (Signed)
She recently started Rx for UTI, last pill today. Contracted MRSA 10 yrs ago, she had an outbreak on right elbow last week.  Having lower back pain 7/10

## 2017-02-20 LAB — URINE CULTURE, OB REFLEX

## 2017-02-20 LAB — CULTURE, OB URINE

## 2017-03-18 ENCOUNTER — Ambulatory Visit (INDEPENDENT_AMBULATORY_CARE_PROVIDER_SITE_OTHER): Payer: 59 | Admitting: Certified Nurse Midwife

## 2017-03-18 ENCOUNTER — Encounter: Payer: Self-pay | Admitting: Certified Nurse Midwife

## 2017-03-18 VITALS — BP 117/80 | HR 82 | Wt 360.0 lb

## 2017-03-18 DIAGNOSIS — O09299 Supervision of pregnancy with other poor reproductive or obstetric history, unspecified trimester: Secondary | ICD-10-CM

## 2017-03-18 DIAGNOSIS — O10919 Unspecified pre-existing hypertension complicating pregnancy, unspecified trimester: Secondary | ICD-10-CM | POA: Insufficient documentation

## 2017-03-18 DIAGNOSIS — O09292 Supervision of pregnancy with other poor reproductive or obstetric history, second trimester: Secondary | ICD-10-CM

## 2017-03-18 DIAGNOSIS — O0990 Supervision of high risk pregnancy, unspecified, unspecified trimester: Secondary | ICD-10-CM

## 2017-03-18 DIAGNOSIS — Z8759 Personal history of other complications of pregnancy, childbirth and the puerperium: Secondary | ICD-10-CM

## 2017-03-18 DIAGNOSIS — O10912 Unspecified pre-existing hypertension complicating pregnancy, second trimester: Secondary | ICD-10-CM

## 2017-03-18 DIAGNOSIS — Z98891 History of uterine scar from previous surgery: Secondary | ICD-10-CM

## 2017-03-18 DIAGNOSIS — O0992 Supervision of high risk pregnancy, unspecified, second trimester: Secondary | ICD-10-CM

## 2017-03-18 MED ORDER — ASPIRIN 81 MG PO CHEW: 81.0000 mg | CHEWABLE_TABLET | Freq: Every day | ORAL | 12 refills | Status: DC

## 2017-03-18 NOTE — Progress Notes (Signed)
   PRENATAL VISIT NOTE  Subjective:  April Gregory is a 36 y.o. [phone] at 4190w3d being seen today for ongoing prenatal care.  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  Patient reports no complaints.  Contractions: Not present. Vag. Bleeding: None.  Movement: Present. Denies leaking of fluid.   The following portions of the patient's history were reviewed and updated as appropriate: allergies, current medications, past family history, past medical history, past social history, past surgical history and problem list. Problem list updated.  Objective:   Vitals:   03/18/17 1035  BP: 117/80  Pulse: 82  Weight: (!) 360 lb (163.3 kg)    Fetal Status: Fetal Heart Rate (bpm): 145; doppler   Movement: Present     General:  Alert, oriented and cooperative. Patient is in no acute distress.  Skin: Skin is warm and dry. No rash noted.   Cardiovascular: Normal heart rate noted  Respiratory: Normal respiratory effort, no problems with respiration noted  Abdomen: Soft, gravid, appropriate for gestational age.  Pain/Pressure: Absent     Pelvic: Cervical exam deferred        Extremities: Normal range of motion.     Mental Status:  Normal mood and affect. Normal behavior. Normal judgment and thought content.   Assessment and Plan:  Pregnancy: G4P3003 at 3390w3d  1. Supervision of high risk pregnancy, antepartum      Doing well.  Has anatomy US scheduled for 03/25/17.  - MaterniT21 PLUS Core+SCA - AFP, Serum, Open Spina Bifida  2. Hx of preeclampsia, prior pregnancy, currently pregnant       3. History of C-section      Repeat  C-section at 39 weeks planned  4. Chronic hypertension during pregnancy, antepartum     Encouraged to take OTC baby ASA.  Normotensive today, previously elevated in records.  5. History of pregnancy induced hypertension     - aspirin 81 MG chewable tablet; Chew 1 tablet (81 mg total) by mouth daily.  Dispense: 30 tablet; Refill: 12  Preterm labor symptoms and general obstetric precautions including but not limited to vaginal bleeding, contractions, leaking of fluid and fetal movement were reviewed in detail with the patient. Please refer to After Visit Summary for other counseling recommendations.  Return in about 4 weeks (around 04/15/2017) for ROB, HOB, Needs to see FP MD here.   Roe Coombsachelle A Jaylah Goodlow, CNM

## 2017-03-19 ENCOUNTER — Encounter (HOSPITAL_COMMUNITY): Payer: Self-pay | Admitting: Certified Nurse Midwife

## 2017-03-23 LAB — MATERNIT21 PLUS CORE+SCA
CHROMOSOME 18: NEGATIVE
Chromosome 13: NEGATIVE
Chromosome 21: NEGATIVE
Y Chromosome: NOT DETECTED

## 2017-03-24 ENCOUNTER — Ambulatory Visit (HOSPITAL_COMMUNITY): Payer: 59

## 2017-03-24 ENCOUNTER — Other Ambulatory Visit: Payer: Self-pay | Admitting: Certified Nurse Midwife

## 2017-03-24 DIAGNOSIS — O0990 Supervision of high risk pregnancy, unspecified, unspecified trimester: Secondary | ICD-10-CM

## 2017-03-24 LAB — AFP, SERUM, OPEN SPINA BIFIDA
AFP MoM: 1.53
AFP VALUE AFPOSL: 40.3 ng/mL
Gest. Age on Collection Date: 17.3 weeks
Maternal Age At EDD: 34.9 yr
OSBR RISK 1 IN: 5046
Test Results:: NEGATIVE
WEIGHT: 360 [lb_av]

## 2017-03-25 ENCOUNTER — Ambulatory Visit (HOSPITAL_COMMUNITY)
Admission: RE | Admit: 2017-03-25 | Discharge: 2017-03-25 | Disposition: A | Discharge status: Home / Self Care | Payer: 59 | Source: Ambulatory Visit | Attending: Certified Nurse Midwife | Admitting: Certified Nurse Midwife

## 2017-03-25 ENCOUNTER — Other Ambulatory Visit: Payer: Self-pay | Admitting: Certified Nurse Midwife

## 2017-03-25 ENCOUNTER — Encounter (HOSPITAL_COMMUNITY): Payer: Self-pay

## 2017-03-25 DIAGNOSIS — Z3689 Encounter for other specified antenatal screening: Secondary | ICD-10-CM | POA: Insufficient documentation

## 2017-03-25 DIAGNOSIS — O0990 Supervision of high risk pregnancy, unspecified, unspecified trimester: Secondary | ICD-10-CM

## 2017-03-25 DIAGNOSIS — O99212 Obesity complicating pregnancy, second trimester: Secondary | ICD-10-CM

## 2017-03-25 DIAGNOSIS — O34219 Maternal care for unspecified type scar from previous cesarean delivery: Secondary | ICD-10-CM | POA: Diagnosis present

## 2017-03-25 DIAGNOSIS — O10012 Pre-existing essential hypertension complicating pregnancy, second trimester: Secondary | ICD-10-CM

## 2017-03-25 DIAGNOSIS — Z3480 Encounter for supervision of other normal pregnancy, unspecified trimester: Secondary | ICD-10-CM

## 2017-03-25 DIAGNOSIS — Z3A18 18 weeks gestation of pregnancy: Secondary | ICD-10-CM

## 2017-03-26 ENCOUNTER — Other Ambulatory Visit (HOSPITAL_COMMUNITY): Payer: Self-pay | Admitting: *Deleted

## 2017-03-26 DIAGNOSIS — IMO0002 Reserved for concepts with insufficient information to code with codable children: Secondary | ICD-10-CM

## 2017-03-26 DIAGNOSIS — Z0489 Encounter for examination and observation for other specified reasons: Secondary | ICD-10-CM

## 2017-03-27 ENCOUNTER — Other Ambulatory Visit: Payer: Self-pay | Admitting: Certified Nurse Midwife

## 2017-03-27 DIAGNOSIS — O0990 Supervision of high risk pregnancy, unspecified, unspecified trimester: Secondary | ICD-10-CM

## 2017-04-15 ENCOUNTER — Encounter: Payer: 59 | Admitting: Obstetrics & Gynecology

## 2017-04-15 ENCOUNTER — Ambulatory Visit (INDEPENDENT_AMBULATORY_CARE_PROVIDER_SITE_OTHER): Payer: 59 | Admitting: Certified Nurse Midwife

## 2017-04-15 ENCOUNTER — Encounter: Payer: Self-pay | Admitting: Certified Nurse Midwife

## 2017-04-15 VITALS — BP 126/76 | HR 109 | Wt 366.2 lb

## 2017-04-15 DIAGNOSIS — O2342 Unspecified infection of urinary tract in pregnancy, second trimester: Secondary | ICD-10-CM

## 2017-04-15 DIAGNOSIS — O0990 Supervision of high risk pregnancy, unspecified, unspecified trimester: Secondary | ICD-10-CM

## 2017-04-15 DIAGNOSIS — O0992 Supervision of high risk pregnancy, unspecified, second trimester: Secondary | ICD-10-CM

## 2017-04-15 DIAGNOSIS — O2340 Unspecified infection of urinary tract in pregnancy, unspecified trimester: Secondary | ICD-10-CM

## 2017-04-15 DIAGNOSIS — O10919 Unspecified pre-existing hypertension complicating pregnancy, unspecified trimester: Secondary | ICD-10-CM

## 2017-04-15 DIAGNOSIS — O10912 Unspecified pre-existing hypertension complicating pregnancy, second trimester: Secondary | ICD-10-CM

## 2017-04-15 NOTE — Progress Notes (Signed)
   PRENATAL VISIT NOTE  Subjective:  April Gregory is a 36 y.o. [phone] at 41w3dbeing seen today for ongoing prenatal care.  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  Patient reports headache and "feeling anxious on and off". Symptoms have been present 3 days. She reports not having an hx of anxiety attacks, states "this feeling only occurs when she is pregnant, it happened the last time before her blood pressure started going up".  Contractions: Not present. Vag. Bleeding: None.  Movement: Present. Denies leaking of fluid.   The following portions of the patient's history were reviewed and updated as appropriate: allergies, current medications, past family history, past medical history, past social history, past surgical history and problem list. Problem list updated.  Objective:   Vitals:   04/15/17 1408  BP: 126/76  Pulse: (!) 109  Weight: (!) 366 lb 3.2 oz (166.1 kg)    Fetal Status: Fetal Heart Rate (bpm): 155; doppler   Movement: Present     General:  Alert, oriented and cooperative. Patient is in no acute distress.  Skin: Skin is warm and dry. No rash noted.   Cardiovascular: Normal heart rate noted  Respiratory: Normal respiratory effort, no problems with respiration noted  Abdomen: Soft, gravid, appropriate for gestational age.  Pain/Pressure: Present     Pelvic: Cervical exam deferred        Extremities: Normal range of motion.  Edema: Trace  Mental Status:  Normal mood and affect. Normal behavior. Normal judgment and thought content.   Assessment and Plan:  Pregnancy: G4P3003 at 216w3d1. Supervision of  high risk pregnancy, antepartum -Discussed use of Tylenol in pregnancy for HA, patient states using last week and HA was relieved. -Discussed stress relief methods to reduce anxious feeling- patient urges that she does not have anxiety  2. Chronic hypertension during pregnancy, antepartum -Baseline labs obtained due to HA, not currently on medication and BP is normotensive  - CBC - Comp Met (CMET) - Protein / creatinine ratio, urine  3. Obesity affecting pregnancy, second trimester   Preterm labor symptoms and general obstetric precautions including but not limited to vaginal bleeding, contractions, leaking of fluid and fetal movement were reviewed in detail with the patient. Please refer to After Visit Summary for other counseling recommendations.  Return in about 4 weeks (around 05/13/2017) for ROB.   VeLajean ManesCNM

## 2017-04-15 NOTE — Patient Instructions (Signed)
Second Trimester of Pregnancy The second trimester is from week 13 through week 28, month 4 through 6. This is often the time in pregnancy that you feel your best. Often times, morning sickness has lessened or quit. You may have more energy, and you may get hungry more often. Your unborn baby (fetus) is growing rapidly. At the end of the sixth month, he or she is about 9 inches long and weighs about 1 pounds. You will likely feel the baby move (quickening) between 18 and 20 weeks of pregnancy. Follow these instructions at home:  Avoid all smoking, herbs, and alcohol. Avoid drugs not approved by your doctor.  Do not use any tobacco products, including cigarettes, chewing tobacco, and electronic cigarettes. If you need help quitting, ask your doctor. You may get counseling or other support to help you quit.  Only take medicine as told by your doctor. Some medicines are safe and some are not during pregnancy.  Exercise only as told by your doctor. Stop exercising if you start having cramps.  Eat regular, healthy meals.  Wear a good support bra if your breasts are tender.  Do not use hot tubs, steam rooms, or saunas.  Wear your seat belt when driving.  Avoid raw meat, uncooked cheese, and liter boxes and soil used by cats.  Take your prenatal vitamins.  Take 1500-2000 milligrams of calcium daily starting at the 20th week of pregnancy until you deliver your baby.  Try taking medicine that helps you poop (stool softener) as needed, and if your doctor approves. Eat more fiber by eating fresh fruit, vegetables, and whole grains. Drink enough fluids to keep your pee (urine) clear or pale yellow.  Take warm water baths (sitz baths) to soothe pain or discomfort caused by hemorrhoids. Use hemorrhoid cream if your doctor approves.  If you have puffy, bulging veins (varicose veins), wear support hose. Raise (elevate) your feet for 15 minutes, 3-4 times a day. Limit salt in your diet.  Avoid heavy  lifting, wear low heals, and sit up straight.  Rest with your legs raised if you have leg cramps or low back pain.  Visit your dentist if you have not gone during your pregnancy. Use a soft toothbrush to brush your teeth. Be gentle when you floss.  You can have sex (intercourse) unless your doctor tells you not to.  Go to your doctor visits. Get help if:  You feel dizzy.  You have mild cramps or pressure in your lower belly (abdomen).  You have a nagging pain in your belly area.  You continue to feel sick to your stomach (nauseous), throw up (vomit), or have watery poop (diarrhea).  You have bad smelling fluid coming from your vagina.  You have pain with peeing (urination). Get help right away if:  You have a fever.  You are leaking fluid from your vagina.  You have spotting or bleeding from your vagina.  You have severe belly cramping or pain.  You lose or gain weight rapidly.  You have trouble catching your breath and have chest pain.  You notice sudden or extreme puffiness (swelling) of your face, hands, ankles, feet, or legs.  You have not felt the baby move in over an hour.  You have severe headaches that do not go away with medicine.  You have vision changes. This information is not intended to replace advice given to you by your health care provider. Make sure you discuss any questions you have with your health care   provider. Document Released: 07/02/2009 Document Revised: 09/13/2015 Document Reviewed: 06/08/2012 Elsevier Interactive Patient Education  2017 [employer]  

## 2017-04-15 NOTE — Progress Notes (Signed)
ROB c/o: not feeling well x 2-3 days now Pt states she has recently ad a episode of feeling nervous, anxious for no reason.

## 2017-04-16 LAB — COMPREHENSIVE METABOLIC PANEL
ALT: 15 IU/L (ref 0–32)
AST: 16 IU/L (ref 0–40)
Albumin/Globulin Ratio: 0.9 — ABNORMAL LOW (ref 1.2–2.2)
Albumin: 3.5 g/dL (ref 3.5–5.5)
Alkaline Phosphatase: 105 IU/L (ref 39–117)
BUN/Creatinine Ratio: 6 — ABNORMAL LOW (ref 9–23)
BUN: 5 mg/dL — ABNORMAL LOW (ref 6–20)
Bilirubin Total: 0.2 mg/dL (ref 0.0–1.2)
CO2: 22 mmol/L (ref 20–29)
Calcium: 9.1 mg/dL (ref 8.7–10.2)
Chloride: 100 mmol/L (ref 96–106)
Creatinine, Ser: 0.78 mg/dL (ref 0.57–1.00)
GFR calc Af Amer: 115 mL/min/{1.73_m2} (ref >59)
GFR calc non Af Amer: 99 mL/min/{1.73_m2} (ref >59)
Globulin, Total: 3.7 g/dL (ref 1.5–4.5)
Glucose: 79 mg/dL (ref 65–99)
Potassium: 4 mmol/L (ref 3.5–5.2)
Sodium: 136 mmol/L (ref 134–144)
Total Protein: 7.2 g/dL (ref 6.0–8.5)

## 2017-04-16 LAB — PROTEIN / CREATININE RATIO, URINE
Creatinine, Urine: 179.8 mg/dL
Protein, Ur: 12.6 mg/dL
Protein/Creat Ratio: 70 mg/g creat (ref 0–200)

## 2017-04-16 LAB — CBC
Hematocrit: 32.9 % — ABNORMAL LOW (ref 34.0–46.6)
Hemoglobin: 10.7 g/dL — ABNORMAL LOW (ref 11.1–15.9)
MCH: 23.6 pg — ABNORMAL LOW (ref 26.6–33.0)
MCHC: 32.5 g/dL (ref 31.5–35.7)
MCV: 73 fL — ABNORMAL LOW (ref 79–97)
Platelets: 253 10*3/uL (ref 150–379)
RBC: 4.54 x10E6/uL (ref 3.77–5.28)
RDW: 18 % — ABNORMAL HIGH (ref 12.3–15.4)
WBC: 5.8 10*3/uL (ref 3.4–10.8)

## 2017-04-17 LAB — CULTURE, OB URINE

## 2017-04-17 LAB — URINE CULTURE, OB REFLEX

## 2017-04-23 ENCOUNTER — Other Ambulatory Visit: Payer: Self-pay

## 2017-04-23 ENCOUNTER — Inpatient Hospital Stay (HOSPITAL_COMMUNITY)
Admission: AD | Admit: 2017-04-23 | Discharge: 2017-04-26 | DRG: 833 | Disposition: A | Discharge status: Home / Self Care | Payer: 59 | Source: Ambulatory Visit | Attending: Obstetrics & Gynecology | Admitting: Obstetrics & Gynecology

## 2017-04-23 ENCOUNTER — Encounter (HOSPITAL_COMMUNITY): Payer: Self-pay | Admitting: *Deleted

## 2017-04-23 DIAGNOSIS — Z3A22 22 weeks gestation of pregnancy: Secondary | ICD-10-CM | POA: Diagnosis not present

## 2017-04-23 DIAGNOSIS — Z880 Allergy status to penicillin: Secondary | ICD-10-CM

## 2017-04-23 DIAGNOSIS — O2302 Infections of kidney in pregnancy, second trimester: Principal | ICD-10-CM | POA: Diagnosis present

## 2017-04-23 DIAGNOSIS — R300 Dysuria: Secondary | ICD-10-CM | POA: Diagnosis not present

## 2017-04-23 HISTORY — DX: Anemia, unspecified: D64.9

## 2017-04-23 HISTORY — DX: Unspecified infectious disease: B99.9

## 2017-04-23 LAB — URINALYSIS, ROUTINE W REFLEX MICROSCOPIC
BILIRUBIN URINE: NEGATIVE
Glucose, UA: NEGATIVE mg/dL
Ketones, ur: NEGATIVE mg/dL
Nitrite: POSITIVE — AB
PROTEIN: NEGATIVE mg/dL
Specific Gravity, Urine: 1.012 (ref 1.005–1.030)
pH: 6 (ref 5.0–8.0)

## 2017-04-23 LAB — CBC
HEMATOCRIT: 32.1 % — AB (ref 36.0–46.0)
Hemoglobin: 10.3 g/dL — ABNORMAL LOW (ref 12.0–15.0)
MCH: 23.6 pg — ABNORMAL LOW (ref 26.0–34.0)
MCHC: 32.1 g/dL (ref 30.0–36.0)
MCV: 73.5 fL — ABNORMAL LOW (ref 78.0–100.0)
PLATELETS: 253 10*3/uL (ref 150–400)
RBC: 4.37 MIL/uL (ref 3.87–5.11)
RDW: 16.9 % — AB (ref 11.5–15.5)
WBC: 8.2 10*3/uL (ref 4.0–10.5)

## 2017-04-23 LAB — MRSA PCR SCREENING: MRSA by PCR: NEGATIVE

## 2017-04-23 MED ORDER — ZOLPIDEM TARTRATE 5 MG PO TABS: 5.0000 mg | ORAL_TABLET | Freq: Every evening | ORAL | Status: DC | PRN

## 2017-04-23 MED ORDER — SODIUM CHLORIDE 0.9 % IV SOLN
INTRAVENOUS | Status: DC
  Administered 2017-04-23 – 2017-04-24 (×4): via INTRAVENOUS
  Administered 2017-04-25: 125 mL/h via INTRAVENOUS
  Administered 2017-04-25 – 2017-04-26 (×3): via INTRAVENOUS

## 2017-04-23 MED ORDER — ACETAMINOPHEN 325 MG PO TABS
650.0000 mg | ORAL_TABLET | ORAL | Status: DC | PRN
  Administered 2017-04-23 – 2017-04-24 (×2): 650 mg via ORAL
  Filled 2017-04-23 (×2): qty 2

## 2017-04-23 MED ORDER — DOCUSATE SODIUM 100 MG PO CAPS
100.0000 mg | ORAL_CAPSULE | Freq: Every day | ORAL | Status: DC
  Administered 2017-04-24 – 2017-04-25 (×2): 100 mg via ORAL
  Filled 2017-04-23 (×2): qty 1

## 2017-04-23 MED ORDER — DEXTROSE 5 % IV SOLN
2.0000 g | INTRAVENOUS | Status: DC
  Administered 2017-04-23 – 2017-04-25 (×3): 2 g via INTRAVENOUS
  Filled 2017-04-23 (×3): qty 2

## 2017-04-23 MED ORDER — CALCIUM CARBONATE ANTACID 500 MG PO CHEW: 2.0000 | CHEWABLE_TABLET | ORAL | Status: DC | PRN

## 2017-04-23 MED ORDER — PRENATAL MULTIVITAMIN CH
1.0000 | ORAL_TABLET | Freq: Every day | ORAL | Status: DC
  Administered 2017-04-24 – 2017-04-25 (×2): 1 via ORAL
  Filled 2017-04-23 (×2): qty 1

## 2017-04-23 NOTE — MAU Provider Note (Signed)
History     CSN: [id number]  Arrival date and time: 04/23/17 1420   First Provider Initiated Contact with Patient 04/23/17 1709      Chief Complaint  Patient presents with  . Abdominal Pain  . Emesis  . UTI symptoms   HPI April Gregory is a 37 y.o. [phone] at [redacted]w[redacted]d who presents with flank pain & dysuria. Hx of UTI at beginning of pregnancy (E. Coli, with negative TOC). Current symptoms began on Sunday. Dysuria since Sunday; initially was burning but since today has felt like increased pressure & urgency. Yesterday had chills & n/v. Did not check temperature at home. Right flank pain & RLQ pain since this morning. Rates pain 10/10. Describes as constant throbbing & cramping. Took tylenol yesterday without relief. Has not taken anything today. Denies hematuria, contractions, or vaginal bleeding.   OB History    Gravida Para Term Preterm AB Living   4 3 3     3    SAB TAB Ectopic Multiple Live Births           3      Past Medical History:  Diagnosis Date  . Anemia   . Anxiety   . Infection    UTI  . MRSA (methicillin resistant staph aureus) culture positive   . Pregnancy induced hypertension   . Spinal headache   . Vaginal Pap smear, abnormal     Past Surgical History:  Procedure Laterality Date  . CESAREAN SECTION    . COLPOSCOPY      Family History  Problem Relation Age of Onset  . Cancer Mother   . Hypertension Mother   . Arthritis Mother   . Learning disabilities Son   . Arthritis Maternal Grandmother   . Cancer Maternal Grandmother     Social History   Tobacco Use  . Smoking status: Never Smoker  . Smokeless tobacco: Never Used  Substance Use Topics  . Alcohol use: No    Alcohol/week: 0.0 oz    Comment: social  . Drug use: No    Allergies:  Allergies  Allergen Reactions  . Flagyl [Metronidazole] Hives  . Penicillins Other (See Comments)    Has patient had a PCN reaction causing immediate rash, facial/tongue/throat swelling, SOB or  lightheadedness with hypotension: No Has patient had a PCN reaction causing severe rash involving mucus membranes or skin necrosis: No Has patient had a PCN reaction that required hospitalization No Has patient had a PCN reaction occurring within the last 10 years:yes...5-6 years ago If all of the above answers are "NO", then may proceed with Cephalosporin use.    Medications Prior to Admission  Medication Sig Dispense Refill Last Dose  . aspirin 81 MG chewable tablet Chew 1 tablet (81 mg total) by mouth daily. (Patient not taking: Reported on 03/25/2017) 30 tablet 12 Not Taking  . Prenat-FeFmCb-DSS-FA-DHA w/o A (CITRANATAL HARMONY) 27-1-260 MG CAPS Take 1 tablet by mouth daily. (Patient not taking: Reported on 03/25/2017) 30 capsule 12 Not Taking  . Prenatal Vit-Fe Fumarate-FA (PRENATAL MULTIVITAMIN) TABS tablet Take 1 tablet by mouth daily at 12 noon.   Taking    Review of Systems  Constitutional: Positive for chills. Negative for fever.  Gastrointestinal: Positive for abdominal pain, nausea and vomiting (none currently).  Genitourinary: Positive for dysuria, flank pain and urgency. Negative for hematuria and vaginal bleeding.   Physical Exam   Blood pressure 133/73, pulse 77, temperature 99.2 F (37.3 C), temperature source Oral, resp. rate 18, height 5\' 11"  (  1.803 m), weight (!) 363 lb 12 oz (165 kg), last menstrual period 11/01/2016, SpO2 98 %.  Physical Exam  Nursing note and vitals reviewed. Constitutional: She is oriented to person, place, and time. She appears well-developed and well-nourished. No distress.  HENT:  Head: Normocephalic and atraumatic.  Eyes: Conjunctivae are normal. Right eye exhibits no discharge. Left eye exhibits no discharge. No scleral icterus.  Neck: Normal range of motion.  Respiratory: Effort normal. No respiratory distress.  GI: Soft. There is CVA tenderness (right). There is no rigidity, no rebound and no guarding.  Genitourinary:  Genitourinary  Comments: Dilation: Closed Effacement (%): Thick Cervical Position: Posterior Exam by:: Judeth HornErin Eugina Row NP   Neurological: She is alert and oriented to person, place, and time.  Skin: Skin is warm and dry. She is not diaphoretic.  Psychiatric: She has a normal mood and affect. Her behavior is normal. Judgment and thought content normal.    MAU Course  Procedures Results for orders placed or performed during the hospital encounter of 04/23/17 (from the past 24 hour(s))  Urinalysis, Routine w reflex microscopic     Status: Abnormal   Collection Time: 04/23/17  3:02 PM  Result Value Ref Range   Color, Urine YELLOW YELLOW   APPearance CLOUDY (A) CLEAR   Specific Gravity, Urine 1.012 1.005 - 1.030   pH 6.0 5.0 - 8.0   Glucose, UA NEGATIVE NEGATIVE mg/dL   Hgb urine dipstick SMALL (A) NEGATIVE   Bilirubin Urine NEGATIVE NEGATIVE   Ketones, ur NEGATIVE NEGATIVE mg/dL   Protein, ur NEGATIVE NEGATIVE mg/dL   Nitrite POSITIVE (A) NEGATIVE   Leukocytes, UA MODERATE (A) NEGATIVE   RBC / HPF 0-5 0 - 5 RBC/hpf   WBC, UA TOO NUMEROUS TO COUNT 0 - 5 WBC/hpf   Bacteria, UA MANY (A) NONE SEEN   Squamous Epithelial / LPF 6-30 (A) NONE SEEN   WBC Clumps PRESENT    Mucus PRESENT     MDM FHT 145 U/a shows + nitrites -- urine culture sent + right CVAT & flank pain Cervix closed C/w Dr. Nameer Summer FullingHarraway-Smith. Will keep in hospital for IV antibiotics. Reviewed pt's allergies. Allergy to penicillin of ?hives several years ago & pt unsure if she's ever had cephalosporins. Cross reactivity risk low, will give ceftriaxone.   Assessment and Plan  A: 1. Pyelonephritis affecting pregnancy in second trimester   2. [redacted] weeks gestation of pregnancy    P: Place in obs bed Ceftriaxone 2 gm Q24 hrs Urine culture pending   Judeth Hornrin Amilliana Hayworth 04/23/2017, 5:09 PM

## 2017-04-23 NOTE — H&P (Signed)
Chief Complaint  Patient presents with  . Abdominal Pain  . Emesis  . UTI symptoms   HPI April Gregory is a 37 y.o. [phone] at [redacted]w[redacted]d who presents with flank pain & dysuria. Hx of UTI at beginning of pregnancy (E. Coli, with negative TOC). Current symptoms began on Sunday. Dysuria since Sunday; initially was burning but since today has felt like increased pressure & urgency. Yesterday had chills & n/v. Did not check temperature at home. Right flank pain & RLQ pain since this morning. Rates pain 10/10. Describes as constant throbbing & cramping. Took tylenol yesterday without relief. Has not taken anything today. Denies hematuria, contractions, or vaginal bleeding.           OB History    Gravida Para Term Preterm AB Living   4 3 3     3    SAB TAB Ectopic Multiple Live Births           3          Past Medical History:  Diagnosis Date  . Anemia   . Anxiety   . Infection    UTI  . MRSA (methicillin resistant staph aureus) culture positive   . Pregnancy induced hypertension   . Spinal headache   . Vaginal Pap smear, abnormal          Past Surgical History:  Procedure Laterality Date  . CESAREAN SECTION    . COLPOSCOPY      Family History  Problem Relation Age of Onset  . Cancer Mother   . Hypertension Mother   . Arthritis Mother   . Learning disabilities Son   . Arthritis Maternal Grandmother   . Cancer Maternal Grandmother     Social History        Tobacco Use  . Smoking status: Never Smoker  . Smokeless tobacco: Never Used  Substance Use Topics  . Alcohol use: No    Alcohol/week: 0.0 oz    Comment: social  . Drug use: No    Allergies:  Allergies  Allergen Reactions  . Flagyl [Metronidazole] Hives  . Penicillins Other (See Comments)    Has patient had a PCN reaction causing immediate rash, facial/tongue/throat swelling, SOB or lightheadedness with hypotension: No Has patient had a PCN reaction causing severe  rash involving mucus membranes or skin necrosis: No Has patient had a PCN reaction that required hospitalization No Has patient had a PCN reaction occurring within the last 10 years:yes...5-6 years ago If all of the above answers are "NO", then may proceed with Cephalosporin use.           Medications Prior to Admission  Medication Sig Dispense Refill Last Dose  . aspirin 81 MG chewable tablet Chew 1 tablet (81 mg total) by mouth daily. (Patient not taking: Reported on 03/25/2017) 30 tablet 12 Not Taking  . Prenat-FeFmCb-DSS-FA-DHA w/o A (CITRANATAL HARMONY) 27-1-260 MG CAPS Take 1 tablet by mouth daily. (Patient not taking: Reported on 03/25/2017) 30 capsule 12 Not Taking  . Prenatal Vit-Fe Fumarate-FA (PRENATAL MULTIVITAMIN) TABS tablet Take 1 tablet by mouth daily at 12 noon.   Taking    Review of Systems  Constitutional: Positive for chills. Negative for fever.  Gastrointestinal: Positive for abdominal pain, nausea and vomiting (none currently).  Genitourinary: Positive for dysuria, flank pain and urgency. Negative for hematuria and vaginal bleeding.   Physical Exam   Blood pressure 133/73, pulse 77, temperature 99.2 F (37.3 C), temperature source Oral, resp. rate 18, height 5\' 11"  (  1.803 m), weight (!) 363 lb 12 oz (165 kg), last menstrual period 11/01/2016, SpO2 98 %.  Physical Exam  Nursing note and vitals reviewed. Constitutional: She is oriented to person, place, and time. She appears well-developed and well-nourished. No distress.  HENT:  Head: Normocephalic and atraumatic.  Eyes: Conjunctivae are normal. Right eye exhibits no discharge. Left eye exhibits no discharge. No scleral icterus.  Neck: Normal range of motion.  Respiratory: Effort normal. No respiratory distress.  GI: Soft. There is CVA tenderness (right). There is no rigidity, no rebound and no guarding.  Genitourinary:  Genitourinary Comments: Dilation: Closed Effacement (%): Thick Cervical Position:  Posterior Exam by:: Judeth HornErin Lawrence NP   Neurological: She is alert and oriented to person, place, and time.  Skin: Skin is warm and dry. She is not diaphoretic.  Psychiatric: She has a normal mood and affect. Her behavior is normal. Judgment and thought content normal.    MAU Course  Procedures LabResultsLast24Hours  Results for orders placed or performed during the hospital encounter of 04/23/17 (from the past 24 hour(s))  Urinalysis, Routine w reflex microscopic     Status: Abnormal   Collection Time: 04/23/17  3:02 PM  Result Value Ref Range   Color, Urine YELLOW YELLOW   APPearance CLOUDY (A) CLEAR   Specific Gravity, Urine 1.012 1.005 - 1.030   pH 6.0 5.0 - 8.0   Glucose, UA NEGATIVE NEGATIVE mg/dL   Hgb urine dipstick SMALL (A) NEGATIVE   Bilirubin Urine NEGATIVE NEGATIVE   Ketones, ur NEGATIVE NEGATIVE mg/dL   Protein, ur NEGATIVE NEGATIVE mg/dL   Nitrite POSITIVE (A) NEGATIVE   Leukocytes, UA MODERATE (A) NEGATIVE   RBC / HPF 0-5 0 - 5 RBC/hpf   WBC, UA TOO NUMEROUS TO COUNT 0 - 5 WBC/hpf   Bacteria, UA MANY (A) NONE SEEN   Squamous Epithelial / LPF 6-30 (A) NONE SEEN   WBC Clumps PRESENT    Mucus PRESENT       MDM FHT 145 U/a shows + nitrites -- urine culture sent + right CVAT & flank pain Cervix closed C/w Dr. Erin FullingHarraway-Smith. Will keep in hospital for IV antibiotics. Reviewed pt's allergies. Allergy to penicillin of ?hives several years ago & pt unsure if she's ever had cephalosporins. Cross reactivity risk low, will give ceftriaxone.   Assessment and Plan  A: 1. Pyelonephritis affecting pregnancy in second trimester   2. [redacted] weeks gestation of pregnancy    P: Place in obs bed Ceftriaxone 2 gm Q24 hrs Urine culture pending  I have seen and examined pt. She reports that her abd pain is imporving but, still present. She had no acute abd issues or signs of sepsis.   Rec continued care at present .    Quina Wilbourne L.  Harraway-Smith, M.D., Evern CoreFACOG

## 2017-04-23 NOTE — MAU Note (Signed)
Pt presents with c/o right lower abdominal pain that began yesterday.  Pt also reports having urinary urgency and pressure.  States started vomiting yesterday, denies emesis today, has been able to keep food & fluids down.  Denies VB or LOF.  Reports +FM.

## 2017-04-23 NOTE — MAU Note (Signed)
Pain started on Sunday, pain and pressure with urination.  No longer having the pain with urination, still feels pressure. Yesterday, started having pain in RLQ, threw up last night.

## 2017-04-24 DIAGNOSIS — O2302 Infections of kidney in pregnancy, second trimester: Secondary | ICD-10-CM | POA: Diagnosis not present

## 2017-04-24 DIAGNOSIS — Z3A22 22 weeks gestation of pregnancy: Secondary | ICD-10-CM | POA: Diagnosis not present

## 2017-04-24 MED ORDER — PHENAZOPYRIDINE HCL 200 MG PO TABS
200.0000 mg | ORAL_TABLET | Freq: Three times a day (TID) | ORAL | Status: DC
  Administered 2017-04-24 – 2017-04-25 (×6): 200 mg via ORAL
  Filled 2017-04-24 (×8): qty 1
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
## 2017-04-24 NOTE — Progress Notes (Signed)
Patient ID: April Gregory, female   DOB: 1980/12/19, 37 y.o.   MRN: [id number] FACULTY PRACTICE ANTEPARTUM(COMPREHENSIVE) NOTE  April Gregory is a 37 y.o. [phone] at [redacted]w[redacted]d who is admitted for right pyelonephritis.   Fetal presentation is unsure. Length of Stay:  1  Days  Subjective: Less back pain, has dysuria Patient reports the fetal movement as active. Patient reports uterine contraction  activity as none. Patient reports  vaginal bleeding as none. Patient describes fluid per vagina as None.  Vitals:  Blood pressure (!) 120/50, pulse 75, temperature 98.1 F (36.7 C), temperature source Oral, resp. rate 19, height 5\' 11"  (1.803 m), weight (!) 357 lb 0.8 oz (162 kg), last menstrual period 11/01/2016, SpO2 100 %. Physical Examination:  General appearance - alert, well appearing, and in no distress Heart - normal rate and regular rhythm Abdomen - soft, nontender, nondistended Fundal Height:  size equals dates Cervical Exam: Not evaluated.  Extremities: extremities normal, atraumatic, no cyanosis or edema and Homans sign is negative Membranes:intact  Fetal Monitoring:     Fetal Heart Rate A  Mode Doppler filed at 04/24/2017 0040  Baseline Rate (A) 144 bpm filed at 04/24/2017 0040     Labs:  Results for orders placed or performed during the hospital encounter of 04/23/17 (from the past 24 hour(s))  Urinalysis, Routine w reflex microscopic   Collection Time: 04/23/17  3:02 PM  Result Value Ref Range   Color, Urine YELLOW YELLOW   APPearance CLOUDY (A) CLEAR   Specific Gravity, Urine 1.012 1.005 - 1.030   pH 6.0 5.0 - 8.0   Glucose, UA NEGATIVE NEGATIVE mg/dL   Hgb urine dipstick SMALL (A) NEGATIVE   Bilirubin Urine NEGATIVE NEGATIVE   Ketones, ur NEGATIVE NEGATIVE mg/dL   Protein, ur NEGATIVE NEGATIVE mg/dL   Nitrite POSITIVE (A) NEGATIVE   Leukocytes, UA MODERATE (A) NEGATIVE   RBC / HPF 0-5 0 - 5 RBC/hpf   WBC, UA TOO NUMEROUS TO COUNT 0 - 5 WBC/hpf   Bacteria, UA  MANY (A) NONE SEEN   Squamous Epithelial / LPF 6-30 (A) NONE SEEN   WBC Clumps PRESENT    Mucus PRESENT   CBC on admission   Collection Time: 04/23/17  6:10 PM  Result Value Ref Range   WBC 8.2 4.0 - 10.5 K/uL   RBC 4.37 3.87 - 5.11 MIL/uL   Hemoglobin 10.3 (L) 12.0 - 15.0 g/dL   HCT 13.0 (L) 86.5 - 78.4 %   MCV 73.5 (L) 78.0 - 100.0 fL   MCH 23.6 (L) 26.0 - 34.0 pg   MCHC 32.1 30.0 - 36.0 g/dL   RDW 69.6 (H) 29.5 - 28.4 %   Platelets 253 150 - 400 K/uL  MRSA PCR Screening   Collection Time: 04/23/17  6:20 PM  Result Value Ref Range   MRSA by PCR NEGATIVE NEGATIVE     Medications:  Scheduled . docusate sodium  100 mg Oral Daily  . phenazopyridine  200 mg Oral TID WC  . prenatal multivitamin  1 tablet Oral Q1200   I have reviewed the patient's current medications.  ASSESSMENT: Patient Active Problem List   Diagnosis Date Noted  . Pyelonephritis complicating pregnancy in second trimester 04/23/2017  . Chronic hypertension during pregnancy, antepartum 03/18/2017  . Hx of preeclampsia, prior pregnancy, currently pregnant 02/04/2017  . 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 UTI in pregnancy, first trimester 01/26/2017  . Supervision of high risk pregnancy, antepartum 01/21/2017  . Allergy to  sulfa drugs 05/16/2016  . History of C-section 10/08/2011  . Obesity, unspecified 06/08/2008    PLAN: Continue antibiotic until pain improved and afebrile 48 hr. Will add Pyridium for dysuria  Scheryl DarterJames Jasneet Schobert 04/24/2017,7:51 AM

## 2017-04-25 DIAGNOSIS — Z880 Allergy status to penicillin: Secondary | ICD-10-CM | POA: Diagnosis not present

## 2017-04-25 DIAGNOSIS — R300 Dysuria: Secondary | ICD-10-CM | POA: Diagnosis present

## 2017-04-25 DIAGNOSIS — Z3A22 22 weeks gestation of pregnancy: Secondary | ICD-10-CM | POA: Diagnosis not present

## 2017-04-25 DIAGNOSIS — O2302 Infections of kidney in pregnancy, second trimester: Secondary | ICD-10-CM | POA: Diagnosis present

## 2017-04-25 DIAGNOSIS — Z3A23 23 weeks gestation of pregnancy: Secondary | ICD-10-CM | POA: Diagnosis not present

## 2017-04-25 LAB — CULTURE, OB URINE: Culture: >=100000 — AB
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
## 2017-04-25 NOTE — Progress Notes (Signed)
Patient ID: April Gregory, female   DOB: 04/11/1981, 37 y.o.   MRN: [id number] FACULTY PRACTICE ANTEPARTUM(COMPREHENSIVE) NOTE  April Gregory is a 37 y.o. [phone] with Estimated Date of Delivery: 08/23/17    [id number]  who is admitted for pyelonephritis.    Fetal presentation is unsure. Length of Stay:  1  Days  Date of admission:04/23/2017  Subjective: Pt states her pain is improved Patient reports the fetal movement as active. Patient reports uterine contraction  activity as none. Patient reports  vaginal bleeding as none. Patient describes fluid per vagina as None.  Vitals:  Blood pressure 128/68, pulse 74, temperature 98 F (36.7 C), temperature source Oral, resp. rate 20, height 5\' 11"  (1.803 m), weight (!) 364 lb 0.8 oz (165.1 kg), last menstrual period 11/01/2016, SpO2 99 %. Vitals:   04/24/17 1918 04/24/17 2346 04/25/17 0615 04/25/17 0800  BP: (!) 116/59 (!) 107/51 (!) 114/51 128/68  Pulse: 77 74 79 74  Resp: 19 18 19 20   Temp: 98.4 F (36.9 C) 98.5 F (36.9 C) 98.4 F (36.9 C) 98 F (36.7 C)  TempSrc: Oral Oral Oral Oral  SpO2: 100% 99% 100% 99%  Weight:   (!) 364 lb 0.8 oz (165.1 kg)   Height:       Physical Examination:  General appearance - alert, well appearing, and in no distress Back exam - full range of motion, no tenderness, palpable spasm or pain on motion Fundal Height:  size equals dates Pelvic Exam:  normal external genitalia, vulva, vagina, cervix, uterus and adnexa, examination not indicated Cervical Exam: Extremities: extremities normal, atraumatic, no cyanosis or edema with DTRs 2+ bilaterally Membranes:intact  Fetal Monitoring:       Labs:  No results found for this or any previous visit (from the past 24 hour(s)).  Imaging Studies:      Medications:  Scheduled . docusate sodium  100 mg Oral Daily  . phenazopyridine  200 mg Oral TID WC  . prenatal multivitamin  1 tablet Oral Q1200   I have reviewed the patient's current  medications.  ASSESSMENT: [id number] Estimated Date of Delivery: 08/23/17  Patient Active Problem List   Diagnosis Date Noted  . Pyelonephritis complicating pregnancy in second trimester 04/23/2017  . Chronic hypertension during pregnancy, antepartum 03/18/2017  . Hx of preeclampsia, prior pregnancy, currently pregnant 02/04/2017  . 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 UTI in pregnancy, first trimester 01/26/2017  . Supervision of high risk pregnancy, antepartum 01/21/2017  . Allergy to sulfa drugs 05/16/2016  . History of C-section 10/08/2011  . Obesity, unspecified 06/08/2008    PLAN: Clinical status is improved Continue rocephin another 24 hours  Whitney Bingaman H Tinsley Lomas 04/25/2017,10:50 AM

## 2017-04-26 DIAGNOSIS — Z3A23 23 weeks gestation of pregnancy: Secondary | ICD-10-CM

## 2017-04-26 MED ORDER — CEPHALEXIN 500 MG PO CAPS: 500.0000 mg | ORAL_CAPSULE | Freq: Three times a day (TID) | ORAL | 2 refills | Status: DC

## 2017-04-26 NOTE — Discharge Summary (Signed)
Physician Discharge Summary  Patient ID: April Gregory MRN: [id number] DOB/AGE: 07-05-1980 37 y.o.  Admit date: 04/23/2017 Discharge date: 04/26/2017  Admission Diagnoses: IUP 23 weeks                                         Pyelonephritis  Discharge Diagnoses:  Active Problems:   Pyelonephritis complicating pregnancy in second trimester   Discharged Condition: stable  Hospital Course: Pt was admitted with above Dx. Received IV Rocephin. Resolution of fever and pain. Ambulating and voiding without problems. Tolerating diet. Fetal well being remained good during hospitalization. Felt amendable for discharge home on HOD # 3 to complete medications as listed and follow up as noted.   Consults: None  Significant Diagnostic Studies: labs & urine culture  Treatments: IV hydration and antibiotics:Rocephin  Discharge Exam: Blood pressure (!) 109/43, pulse 70, temperature 98.5 F (36.9 C), temperature source Oral, resp. rate 18, height 5\' 11"  (1.803 m), weight (!) 167.4 kg (369 lb 0.1 oz), last menstrual period 11/01/2016, SpO2 100 %. Lungs clear Heart RRR Abd soft + BS gravid non tender Back no CVA tenderness Ext non tender  Disposition: 01-Home or Self Care  Discharge Instructions    Discharge activity:  No Restrictions   Complete by:  As directed    Discharge diet:  No restrictions   Complete by:  As directed    No sexual activity restrictions   Complete by:  As directed    Notify physician for a general feeling that "something is not right"   Complete by:  As directed    Notify physician for increase or change in vaginal discharge   Complete by:  As directed    Notify physician for intestinal cramps, with or without diarrhea, sometimes described as "gas pain"   Complete by:  As directed    Notify physician for leaking of fluid   Complete by:  As directed    Notify physician for low, dull backache, unrelieved by heat or Tylenol   Complete by:  As directed    Notify  physician for menstrual like cramps   Complete by:  As directed    Notify physician for pelvic pressure   Complete by:  As directed    Notify physician for uterine contractions.  These may be painless and feel like the uterus is tightening or the baby is  "balling up"   Complete by:  As directed    Notify physician for vaginal bleeding   Complete by:  As directed    PRETERM LABOR:  Includes any of the follwing symptoms that occur between 20 - [redacted] weeks gestation.  If these symptoms are not stopped, preterm labor can result in preterm delivery, placing your baby at risk   Complete by:  As directed      Allergies as of 04/26/2017      Reactions   Flagyl [metronidazole] Hives   Other Shortness Of Breath   Pt allergic to Salmon, causes SOB and itching. Pt also allergic to ant bites causes itching, swelling, hives.   Bactrim [sulfamethoxazole-trimethoprim] Itching   Penicillins Other (See Comments)   Has patient had a PCN reaction causing immediate rash, facial/tongue/throat swelling, SOB or lightheadedness with hypotension: No Has patient had a PCN reaction causing severe rash involving mucus membranes or skin necrosis: No Has patient had a PCN reaction that required hospitalization No Has patient had  a PCN reaction occurring within the last 10 years:yes...5-6 years ago If all of the above answers are "NO", then may proceed with Cephalosporin use.      Medication List    TAKE these medications   aspirin 81 MG chewable tablet Chew 1 tablet (81 mg total) by mouth daily.   cephALEXin 500 MG capsule Commonly known as:  KEFLEX Take 1 capsule (500 mg total) by mouth 3 (three) times daily.   CITRANATAL HARMONY 27-1-260 MG Caps Take 1 tablet by mouth daily.      Follow-up Information    [facility]. Schedule an appointment as soon as possible for a visit in 3 week(s).   Specialty:  Obstetrics and Gynecology Contact information: [address] [place] [address] [phone]          Signed: Hermina Staggers 04/26/2017, 6:59 AM

## 2017-04-26 NOTE — Progress Notes (Signed)
Discharge instructions given, questions answered, pt states understanding, signed and given copy 

## 2017-04-26 NOTE — Progress Notes (Signed)
Discharge instructions reviewed with patient per Salena SanerMegan Hoppenbauer, RN.  Patient states understanding of home care, medications, activity, signs/symptoms to report to MD and return MD office visit.  Patients significant other and family will assist with her care @ home.  No home  equipment needed, patient has prescriptions and all personal belongings.  Patient ambulated for discharge in stable condition with staff without incident.

## 2017-04-26 NOTE — Discharge Instructions (Signed)

## 2017-05-05 ENCOUNTER — Other Ambulatory Visit: Payer: Self-pay

## 2017-05-05 ENCOUNTER — Encounter (HOSPITAL_COMMUNITY): Payer: Self-pay

## 2017-05-05 ENCOUNTER — Inpatient Hospital Stay (HOSPITAL_COMMUNITY)
Admission: AD | Admit: 2017-05-05 | Discharge: 2017-05-05 | Disposition: A | Discharge status: Home / Self Care | Payer: 59 | Source: Ambulatory Visit | Attending: Obstetrics and Gynecology | Admitting: Obstetrics and Gynecology

## 2017-05-05 DIAGNOSIS — O212 Late vomiting of pregnancy: Secondary | ICD-10-CM | POA: Diagnosis present

## 2017-05-05 DIAGNOSIS — O219 Vomiting of pregnancy, unspecified: Secondary | ICD-10-CM | POA: Diagnosis not present

## 2017-05-05 DIAGNOSIS — Z7982 Long term (current) use of aspirin: Secondary | ICD-10-CM | POA: Diagnosis not present

## 2017-05-05 DIAGNOSIS — O26892 Other specified pregnancy related conditions, second trimester: Secondary | ICD-10-CM | POA: Diagnosis not present

## 2017-05-05 DIAGNOSIS — R197 Diarrhea, unspecified: Secondary | ICD-10-CM | POA: Insufficient documentation

## 2017-05-05 DIAGNOSIS — Z3A24 24 weeks gestation of pregnancy: Secondary | ICD-10-CM | POA: Diagnosis not present

## 2017-05-05 DIAGNOSIS — R109 Unspecified abdominal pain: Secondary | ICD-10-CM

## 2017-05-05 DIAGNOSIS — Z880 Allergy status to penicillin: Secondary | ICD-10-CM | POA: Insufficient documentation

## 2017-05-05 DIAGNOSIS — T6291XA Toxic effect of unspecified noxious substance eaten as food, accidental (unintentional), initial encounter: Secondary | ICD-10-CM | POA: Diagnosis not present

## 2017-05-05 DIAGNOSIS — IMO0001 Reserved for inherently not codable concepts without codable children: Secondary | ICD-10-CM

## 2017-05-05 DIAGNOSIS — Z882 Allergy status to sulfonamides status: Secondary | ICD-10-CM | POA: Diagnosis not present

## 2017-05-05 DIAGNOSIS — O26899 Other specified pregnancy related conditions, unspecified trimester: Secondary | ICD-10-CM

## 2017-05-05 LAB — URINALYSIS, ROUTINE W REFLEX MICROSCOPIC
BILIRUBIN URINE: NEGATIVE
Glucose, UA: NEGATIVE mg/dL
Hgb urine dipstick: NEGATIVE
KETONES UR: NEGATIVE mg/dL
Nitrite: NEGATIVE
PROTEIN: 30 mg/dL — AB
SPECIFIC GRAVITY, URINE: 1.025 (ref 1.005–1.030)
pH: 5 (ref 5.0–8.0)

## 2017-05-05 MED ORDER — LACTATED RINGERS IV BOLUS (SEPSIS)
1000.0000 mL | Freq: Once | INTRAVENOUS | Status: AC
  Administered 2017-05-05: 1000 mL via INTRAVENOUS

## 2017-05-05 MED ORDER — ONDANSETRON 8 MG PO TBDP: 8.0000 mg | ORAL_TABLET | Freq: Three times a day (TID) | ORAL | 0 refills | Status: DC | PRN

## 2017-05-05 MED ORDER — PROMETHAZINE HCL 25 MG PO TABS: 25.0000 mg | ORAL_TABLET | Freq: Four times a day (QID) | ORAL | 0 refills | Status: DC | PRN

## 2017-05-05 MED ORDER — PROMETHAZINE HCL 25 MG/ML IJ SOLN
25.0000 mg | Freq: Once | INTRAMUSCULAR | Status: AC
  Administered 2017-05-05: 25 mg via INTRAVENOUS
  Filled 2017-05-05: qty 1

## 2017-05-05 NOTE — MAU Note (Signed)
Pt presents to MAU w/ c/o of nausea, vomiting and diarrhea since yesterday. Pt has also had upper abdominal cramping. Pt denies VB and LOF. +FM

## 2017-05-05 NOTE — MAU Provider Note (Signed)
History     CSN: [id number]  Arrival date and time: 05/05/17 1028   First Provider Initiated Contact with Patient 05/05/17 1215     Chief Complaint  Patient presents with  . Abdominal Pain  . Emesis   HPI April Gregory is a 37 y.o. [phone] [id number] at [id number] who presents with nausea, vomiting and diarrhea. She states she ate [place] food from a new restaurant on Sunday night. On Monday she started having profuse diarrhea and nausea. She threw up once this am. She denies any abdominal pain, vaginal bleeding or discharge. Reports good fetal movement.   OB History    Gravida Para Term Preterm AB Living   4 3 3     3    SAB TAB Ectopic Multiple Live Births           3      Past Medical History:  Diagnosis Date  . Anemia   . Anxiety   . Infection    UTI  . MRSA (methicillin resistant staph aureus) culture positive   . Pregnancy induced hypertension   . Spinal headache   . Vaginal Pap smear, abnormal     Past Surgical History:  Procedure Laterality Date  . CESAREAN SECTION    . COLPOSCOPY      Family History  Problem Relation Age of Onset  . Cancer Mother   . Hypertension Mother   . Arthritis Mother   . Learning disabilities Son   . Arthritis Maternal Grandmother   . Cancer Maternal Grandmother     Social History   Tobacco Use  . Smoking status: Never Smoker  . Smokeless tobacco: Never Used  Substance Use Topics  . Alcohol use: No    Alcohol/week: 0.0 oz    Comment: social  . Drug use: No    Allergies:  Allergies  Allergen Reactions  . Flagyl [Metronidazole] Hives  . Other Shortness Of Breath    Pt allergic to Salmon, causes SOB and itching. Pt also allergic to ant bites causes itching, swelling, hives.  . Bactrim [Sulfamethoxazole-Trimethoprim] Itching  . Penicillins Other (See Comments)    Has patient had a PCN reaction causing immediate rash, facial/tongue/throat swelling, SOB or lightheadedness with hypotension: No Has patient had a PCN reaction  causing severe rash involving mucus membranes or skin necrosis: No Has patient had a PCN reaction that required hospitalization No Has patient had a PCN reaction occurring within the last 10 years:yes...5-6 years ago If all of the above answers are "NO", then may proceed with Cephalosporin use.    Medications Prior to Admission  Medication Sig Dispense Refill Last Dose  . aspirin 81 MG chewable tablet Chew 1 tablet (81 mg total) by mouth daily. (Patient not taking: Reported on 03/25/2017) 30 tablet 12 Not Taking  . cephALEXin (KEFLEX) 500 MG capsule Take 1 capsule (500 mg total) by mouth 3 (three) times daily. 28 capsule 2   . Prenat-FeFmCb-DSS-FA-DHA w/o A (CITRANATAL HARMONY) 27-1-260 MG CAPS Take 1 tablet by mouth daily. 30 capsule 12 04/20/2017    Review of Systems  Constitutional: Negative.  Negative for fatigue and fever.  HENT: Negative.   Respiratory: Negative.  Negative for shortness of breath.   Cardiovascular: Negative.  Negative for chest pain.  Gastrointestinal: Positive for diarrhea, nausea and vomiting. Negative for abdominal pain and constipation.  Genitourinary: Negative.  Negative for dysuria.  Neurological: Negative.  Negative for dizziness and headaches.   Physical Exam   Blood pressure 119/79, pulse 91, temperature 98.7  F (37.1 C), temperature source Oral, resp. rate 18, last menstrual period 11/01/2016, SpO2 100 %.  Physical Exam  Nursing note and vitals reviewed. Constitutional: She is oriented to person, place, and time. She appears well-developed and well-nourished. No distress.  HENT:  Head: Normocephalic.  Eyes: Pupils are equal, round, and reactive to light.  Cardiovascular: Normal rate, regular rhythm and normal heart sounds.  Respiratory: Effort normal and breath sounds normal. No respiratory distress.  GI: Soft. Bowel sounds are normal. She exhibits no distension. There is no tenderness.  Neurological: She is alert and oriented to person, place, and  time.  Skin: Skin is warm and dry.  Psychiatric: She has a normal mood and affect. Her behavior is normal. Judgment and thought content normal.   Fetal Tracing:  Baseline: 150 Variability: moderate Accels: 10x10 Decels: none  Toco: none   MAU Course  Procedures Results for orders placed or performed during the hospital encounter of 05/05/17 (from the past 24 hour(s))  Urinalysis, Routine w reflex microscopic     Status: Abnormal   Collection Time: 05/05/17 11:05 AM  Result Value Ref Range   Color, Urine AMBER (A) YELLOW   APPearance CLOUDY (A) CLEAR   Specific Gravity, Urine 1.025 1.005 - 1.030   pH 5.0 5.0 - 8.0   Glucose, UA NEGATIVE NEGATIVE mg/dL   Hgb urine dipstick NEGATIVE NEGATIVE   Bilirubin Urine NEGATIVE NEGATIVE   Ketones, ur NEGATIVE NEGATIVE mg/dL   Protein, ur 30 (A) NEGATIVE mg/dL   Nitrite NEGATIVE NEGATIVE   Leukocytes, UA MODERATE (A) NEGATIVE   RBC / HPF 0-5 0 - 5 RBC/hpf   WBC, UA 6-30 0 - 5 WBC/hpf   Bacteria, UA MANY (A) NONE SEEN   Squamous Epithelial / LPF 6-30 (A) NONE SEEN   Mucus PRESENT    Non Squamous Epithelial 0-5 (A) NONE SEEN   MDM UA IV LR bolus Phenergan 25mg  IV NST reassuring for gestational age Assessment and Plan   1. Food poisoning, accidental or unintentional, initial encounter   2. Nausea and vomiting during pregnancy   3. Diarrhea during pregnancy    -Discharge home in stable condition -Rx for phenergan and zofran sent to patient's pharmacy -BRAT diet and food poisoning precautions discussed -Patient advised to follow-up with [facility] as scheduled for [facility] -Patient may return to MAU as needed or if her condition were to change or worsen  Rolm Bookbinder CNM 05/05/2017, 12:37 PM

## 2017-05-05 NOTE — Discharge Instructions (Signed)
Food Choices to Help Relieve Diarrhea, Adult When you have diarrhea, the foods you eat and your eating habits are very important. Choosing the right foods and drinks can help:  Relieve diarrhea.  Replace lost fluids and nutrients.  Prevent dehydration.  What general guidelines should I follow? Relieving diarrhea  Choose foods with less than 2 g or .07 oz. of fiber per serving.  Limit fats to less than 8 tsp (38 g or 1.34 oz.) a day.  Avoid the following: ? Foods and beverages sweetened with high-fructose corn syrup, honey, or sugar alcohols such as xylitol, sorbitol, and mannitol. ? Foods that contain a lot of fat or sugar. ? Fried, greasy, or spicy foods. ? High-fiber grains, breads, and cereals. ? Raw fruits and vegetables.  Eat foods that are rich in probiotics. These foods include dairy products such as yogurt and fermented milk products. They help increase healthy bacteria in the stomach and intestines (gastrointestinal tract, or GI tract).  If you have lactose intolerance, avoid dairy products. These may make your diarrhea worse.  Take medicine to help stop diarrhea (antidiarrheal medicine) only as told by your health care provider. Replacing nutrients  Eat small meals or snacks every 3-4 hours.  Eat bland foods, such as white rice, toast, or baked potato, until your diarrhea starts to get better. Gradually reintroduce nutrient-rich foods as tolerated or as told by your health care provider. This includes: ? Well-cooked protein foods. ? Peeled, seeded, and soft-cooked fruits and vegetables. ? Low-fat dairy products.  Take vitamin and mineral supplements as told by your health care provider. Preventing dehydration   Start by sipping water or a special solution to prevent dehydration (oral rehydration solution, ORS). Urine that is clear or pale yellow means that you are getting enough fluid.  Try to drink at least 8-10 cups of fluid each day to help replace lost  fluids.  You may add other liquids in addition to water, such as clear juice or decaffeinated sports drinks, as tolerated or as told by your health care provider.  Avoid drinks with caffeine, such as coffee, tea, or soft drinks.  Avoid alcohol. What foods are recommended? The items listed may not be a complete list. Talk with your health care provider about what dietary choices are best for you. Grains White rice. White, [place], or pita breads (fresh or toasted), including plain rolls, buns, or bagels. White pasta. Saltine, soda, or graham crackers. Pretzels. Low-fiber cereal. Cooked cereals made with water (such as cornmeal, farina, or cream cereals). Plain muffins. Matzo. Melba toast. Zwieback. Vegetables Potatoes (without the skin). Most well-cooked and canned vegetables without skins or seeds. Tender lettuce. Fruits Apple sauce. Fruits canned in juice. Cooked apricots, cherries, grapefruit, peaches, pears, or plums. Fresh bananas and cantaloupe. Meats and other protein foods Baked or boiled chicken. Eggs. Tofu. Fish. Seafood. Smooth nut butters. Ground or well-cooked tender beef, ham, veal, lamb, pork, or poultry. Dairy Plain yogurt, kefir, and unsweetened liquid yogurt. Lactose-free milk, buttermilk, skim milk, or soy milk. Low-fat or nonfat hard cheese. Beverages Water. Low-calorie sports drinks. Fruit juices without pulp. Strained tomato and vegetable juices. Decaffeinated teas. Sugar-free beverages not sweetened with sugar alcohols. Oral rehydration solutions, if approved by your health care provider. Seasoning and other foods Bouillon, broth, or soups made from recommended foods. What foods are not recommended? The items listed may not be a complete list. Talk with your health care provider about what dietary choices are best for you. Grains Whole grain, whole wheat,  bran, or rye breads, rolls, pastas, and crackers. Wild or brown rice. Whole grain or bran cereals. Barley. Oats and  oatmeal. Corn tortillas or taco shells. Granola. Popcorn. Vegetables Raw vegetables. Fried vegetables. Cabbage, broccoli, Brussels sprouts, artichokes, baked beans, beet greens, corn, kale, legumes, peas, sweet potatoes, and yams. Potato skins. Cooked spinach and cabbage. Fruits Dried fruit, including raisins and dates. Raw fruits. Stewed or dried prunes. Canned fruits with syrup. Meat and other protein foods Fried or fatty meats. Deli meats. Chunky nut butters. Nuts and seeds. Beans and lentils. Tomasa BlaseBacon. Hot dogs. Sausage. Dairy High-fat cheeses. Whole milk, chocolate milk, and beverages made with milk, such as milk shakes. Half-and-half. Cream. sour cream. Ice cream. Beverages Caffeinated beverages (such as coffee, tea, soda, or energy drinks). Alcoholic beverages. Fruit juices with pulp. Prune juice. Soft drinks sweetened with high-fructose corn syrup or sugar alcohols. High-calorie sports drinks. Fats and oils Butter. Cream sauces. Margarine. Salad oils. Plain salad dressings. Olives. Avocados. Mayonnaise. Sweets and desserts Sweet rolls, doughnuts, and sweet breads. Sugar-free desserts sweetened with sugar alcohols such as xylitol and sorbitol. Seasoning and other foods Honey. Hot sauce. Chili powder. Gravy. Cream-based or milk-based soups. Pancakes and waffles. Summary  When you have diarrhea, the foods you eat and your eating habits are very important.  Make sure you get at least 8-10 cups of fluid each day, or enough to keep your urine clear or pale yellow.  Eat bland foods and gradually reintroduce healthy, nutrient-rich foods as tolerated, or as told by your health care provider.  Avoid high-fiber, fried, greasy, or spicy foods. This information is not intended to replace advice given to you by your health care provider. Make sure you discuss any questions you have with your health care provider. Document Released: 06/28/2003 Document Revised: 04/04/2016 Document Reviewed:  04/04/2016 Elsevier Interactive Patient Education  2018 [employer] Parke SimmersBland Diet A bland diet consists of foods that do not have a lot of fat or fiber. Foods without fat or fiber are easier for the body to digest. They are also less likely to irritate your mouth, throat, stomach, and other parts of your gastrointestinal tract. A bland diet is sometimes called a BRAT diet. What is my plan? Your health care provider or dietitian may recommend specific changes to your diet to prevent and treat your symptoms, such as:  Eating small meals often.  Cooking food until it is soft enough to chew easily.  Chewing your food well.  Drinking fluids slowly.  Not eating foods that are very spicy, sour, or fatty.  Not eating citrus fruits, such as oranges and grapefruit.  What do I need to know about this diet?  Eat a variety of foods from the bland diet food list.  Do not follow a bland diet longer than you have to.  Ask your health care provider whether you should take vitamins. What foods can I eat? Grains  Hot cereals, such as cream of wheat. Bread, crackers, or tortillas made from refined white flour. Rice. Vegetables Canned or cooked vegetables. Mashed or boiled potatoes. Fruits Bananas. Applesauce. Other types of cooked or canned fruit with the skin and seeds removed, such as canned peaches or pears. Meats and Other Protein Sources Scrambled eggs. Creamy peanut butter or other nut butters. Lean, well-cooked meats, such as chicken or fish. Tofu. Soups or broths. Dairy Low-fat dairy products, such as milk, cottage cheese, or yogurt. Beverages Water. Herbal tea. Apple juice. Sweets and Desserts Pudding. Custard. Fruit gelatin. Ice cream. Fats  and Oils Mild salad dressings. Canola or olive oil. The items listed above may not be a complete list of allowed foods or beverages. Contact your dietitian for more options. What foods are not recommended? Foods and ingredients that are often  not recommended include:  Spicy foods, such as hot sauce or salsa.  Fried foods.  Sour foods, such as pickled or fermented foods.  Raw vegetables or fruits, especially citrus or berries.  Caffeinated drinks.  Alcohol.  Strongly flavored seasonings or condiments.  The items listed above may not be a complete list of foods and beverages that are not allowed. Contact your dietitian for more information. This information is not intended to replace advice given to you by your health care provider. Make sure you discuss any questions you have with your health care provider. Document Released: 07/30/2015 Document Revised: 09/13/2015 Document Reviewed: 04/19/2014 Elsevier Interactive Patient Education  2018 [employer]. Food Poisoning Food poisoning is an illness that is caused by eating or drinking contaminated foods or drinks. In most cases, food poisoning is mild and lasts 1-2 days. However, some cases can be serious, especially for people who have weak body defense (immune) systems, older people, children and infants, and pregnant women. What are the causes? Foods can become contaminated with viruses, bacteria, parasites, mold, or chemicals as a result of:  Poor personal hygiene, such as poor hand washing practices.  Storing food improperly, such as not refrigerating raw meat.  Using unclean surfaces for serving, preparing, and storing food.  Cooking or eating with unclean utensils.  If contaminated food is eaten, viruses, bacteria, or parasites can harm the intestine. This often causes severe diarrhea. The most common causes of food poisoning include:  Viruses, such as: ? Norovirus. ? Rotavirus.  Bacteria, such as: ? Salmonella. ? Listeria. ? E. coli (Escherichia coli).  Parasites, such as: ? Giardia. ? Toxoplasmosis.  What are the signs or symptoms? Symptoms may take several hours to appear after you consume contaminated food or drink. Symptoms  include:  Nausea.  Vomiting.  Cramping.  Diarrhea.  Fever and chills.  Muscle aches.  Dehydration. Dehydration can cause you to be tired and thirsty, have a dry mouth, and urinate less frequently.  How is this diagnosed? Your health care provider can diagnose food poisoning with a medical history and physical exam. This will include asking you what you have recently eaten. You may also have tests, including:  Blood tests.  Stool tests.  How is this treated? Treatment focuses on relieving your symptoms and making sure that you are hydrated. You may also be given medicines. In severe cases, hospitalization may be required and you may need to receive fluids through an IV tube. Follow these instructions at home: Eating and drinking   Drink enough fluids to keep your urine clear or pale yellow. You may need to drink small amounts of clear liquids frequently.  Avoid milk, caffeine, and alcohol.  Ask your health care provider for specific rehydration instructions.  Eat small, frequent meals rather than large meals. Medicines  Take over-the-counter and prescription medicines only as told by your health care provider. Ask your health care provider if you should continue to take any of your regular prescribed and over-the-counter medicines.  If you were prescribed an antibiotic medicine, take it as told by your health care provider. Do not stop taking the antibiotic even if you start to feel better. General instructions  Wash your hands thoroughly before you prepare food and after you go  to the bathroom (use the toilet). Make sure people who live with you also wash their hands often.  Clean surfaces that you touch with a product that contains chlorine bleach.  Keep all follow-up visits as told by your health care provider. This is important. How is this prevented?  Wash your hands, food preparation surfaces, and utensils thoroughly before and after you handle raw foods.  Use  separate food preparation surfaces and storage spaces for raw meat and for fruits and vegetables.  Keep refrigerated foods colder than 64F (5C).  Serve hot foods immediately or keep them heated above 164F (60C).  Store dry foods in cool, dry spaces away from excess heat or moisture. Throw out any foods that do not smell right or are in cans that are bulging.  Follow approved canning procedures.  Heat canned foods thoroughly before you taste them.  Drink bottled or sterile water when you travel. Get help right away if: Call 911 or go to the emergency room if:  You have difficulty breathing, swallowing, talking, or moving.  You develop blurred vision.  You cannot eat or drink without vomiting.  You faint.  Your eyes turn yellow.  Your vomiting or diarrhea is persistent.  Abdominal pain develops, increases, or localizes in one small area.  You have a fever.  You have blood or mucus in your stools, or your stools look dark black and tarry.  You have signs of dehydration, such as: ? Dark urine, very little urine, or no urine. ? Cracked lips. ? Not making tears while crying. ? Dry mouth. ? Sunken eyes. ? Sleepiness. ? Weakness. ? Dizziness.  This information is not intended to replace advice given to you by your health care provider. Make sure you discuss any questions you have with your health care provider. Document Released: 01/04/2004 Document Revised: 09/04/2015 Document Reviewed: 10/09/2014 Elsevier Interactive Patient Education  2018 [employer].  Safe Medications in Pregnancy   Acne: Benzoyl Peroxide Salicylic Acid  Backache/Headache: Tylenol: 2 regular strength every 4 hours OR              2 Extra strength every 6 hours  Colds/Coughs/Allergies: Benadryl (alcohol free) 25 mg every 6 hours as needed Breath right strips Claritin Cepacol throat lozenges Chloraseptic throat spray Cold-Eeze- up to three times per day Cough drops, alcohol  free Flonase (by prescription only) Guaifenesin Mucinex Robitussin DM (plain only, alcohol free) Saline nasal spray/drops Sudafed (pseudoephedrine) & Actifed ** use only after [redacted] weeks gestation and if you do not have high blood pressure Tylenol Vicks Vaporub Zinc lozenges Zyrtec   Constipation: Colace Ducolax suppositories Fleet enema Glycerin suppositories Metamucil Milk of magnesia Miralax Senokot Smooth move tea  Diarrhea: Kaopectate Imodium A-D  *NO pepto Bismol  Hemorrhoids: Anusol Anusol HC Preparation H Tucks  Indigestion: Tums Maalox Mylanta Zantac  Pepcid  Insomnia: Benadryl (alcohol free) 25mg  every 6 hours as needed Tylenol PM Unisom, no Gelcaps  Leg Cramps: Tums MagGel  Nausea/Vomiting:  Bonine Dramamine Emetrol Ginger extract Sea bands Meclizine  Nausea medication to take during pregnancy:  Unisom (doxylamine succinate 25 mg tablets) Take one tablet daily at bedtime. If symptoms are not adequately controlled, the dose can be increased to a maximum recommended dose of two tablets daily (1/2 tablet in the morning, 1/2 tablet mid-afternoon and one at bedtime). Vitamin B6 100mg  tablets. Take one tablet twice a day (up to 200 mg per day).  Skin Rashes: Aveeno products Benadryl cream or 25mg  every 6 hours as  needed Calamine Lotion 1% cortisone cream  Yeast infection: Gyne-lotrimin 7 Monistat 7   **If taking multiple medications, please check labels to avoid duplicating the same active ingredients **take medication as directed on the label ** Do not exceed 4000 mg of tylenol in 24 hours **Do not take medications that contain aspirin or ibuprofen

## 2017-05-06 ENCOUNTER — Encounter (HOSPITAL_COMMUNITY): Payer: Self-pay

## 2017-05-06 ENCOUNTER — Ambulatory Visit (HOSPITAL_COMMUNITY)
Admission: RE | Admit: 2017-05-06 | Discharge: 2017-05-06 | Disposition: A | Discharge status: Home / Self Care | Payer: 59 | Source: Ambulatory Visit | Attending: Certified Nurse Midwife | Admitting: Certified Nurse Midwife

## 2017-05-06 ENCOUNTER — Other Ambulatory Visit (HOSPITAL_COMMUNITY): Payer: Self-pay | Admitting: Maternal and Fetal Medicine

## 2017-05-06 DIAGNOSIS — Z3A24 24 weeks gestation of pregnancy: Secondary | ICD-10-CM

## 2017-05-06 DIAGNOSIS — O0990 Supervision of high risk pregnancy, unspecified, unspecified trimester: Secondary | ICD-10-CM

## 2017-05-06 DIAGNOSIS — E669 Obesity, unspecified: Secondary | ICD-10-CM | POA: Insufficient documentation

## 2017-05-06 DIAGNOSIS — O09892 Supervision of other high risk pregnancies, second trimester: Secondary | ICD-10-CM | POA: Diagnosis not present

## 2017-05-06 DIAGNOSIS — O99212 Obesity complicating pregnancy, second trimester: Secondary | ICD-10-CM | POA: Insufficient documentation

## 2017-05-06 DIAGNOSIS — I10 Essential (primary) hypertension: Secondary | ICD-10-CM

## 2017-05-06 DIAGNOSIS — IMO0002 Reserved for concepts with insufficient information to code with codable children: Secondary | ICD-10-CM

## 2017-05-06 DIAGNOSIS — Z0489 Encounter for examination and observation for other specified reasons: Secondary | ICD-10-CM

## 2017-05-06 DIAGNOSIS — Z3689 Encounter for other specified antenatal screening: Secondary | ICD-10-CM | POA: Diagnosis present

## 2017-05-06 DIAGNOSIS — O10012 Pre-existing essential hypertension complicating pregnancy, second trimester: Secondary | ICD-10-CM | POA: Diagnosis present

## 2017-05-06 DIAGNOSIS — O132 Gestational [pregnancy-induced] hypertension without significant proteinuria, second trimester: Secondary | ICD-10-CM

## 2017-05-06 DIAGNOSIS — O34219 Maternal care for unspecified type scar from previous cesarean delivery: Secondary | ICD-10-CM | POA: Insufficient documentation

## 2017-05-06 LAB — CULTURE, OB URINE

## 2017-05-06 NOTE — Addendum Note (Signed)
Encounter addended by: Marcellina MillinMoser, Annalisse Minkoff L, RTR on: 05/06/2017 4:25 PM  Actions taken: Imaging Exam ended

## 2017-05-07 ENCOUNTER — Other Ambulatory Visit (HOSPITAL_COMMUNITY): Payer: Self-pay | Admitting: *Deleted

## 2017-05-07 DIAGNOSIS — O10919 Unspecified pre-existing hypertension complicating pregnancy, unspecified trimester: Secondary | ICD-10-CM

## 2017-05-13 ENCOUNTER — Encounter: Payer: Self-pay | Admitting: Certified Nurse Midwife

## 2017-05-13 ENCOUNTER — Encounter: Payer: 59 | Admitting: Certified Nurse Midwife

## 2017-05-13 ENCOUNTER — Ambulatory Visit (INDEPENDENT_AMBULATORY_CARE_PROVIDER_SITE_OTHER): Payer: 59 | Admitting: Certified Nurse Midwife

## 2017-05-13 VITALS — BP 124/66 | HR 91 | Wt 370.2 lb

## 2017-05-13 DIAGNOSIS — O10919 Unspecified pre-existing hypertension complicating pregnancy, unspecified trimester: Secondary | ICD-10-CM

## 2017-05-13 DIAGNOSIS — O34219 Maternal care for unspecified type scar from previous cesarean delivery: Secondary | ICD-10-CM

## 2017-05-13 DIAGNOSIS — O0992 Supervision of high risk pregnancy, unspecified, second trimester: Secondary | ICD-10-CM

## 2017-05-13 DIAGNOSIS — O10912 Unspecified pre-existing hypertension complicating pregnancy, second trimester: Secondary | ICD-10-CM

## 2017-05-13 DIAGNOSIS — O0990 Supervision of high risk pregnancy, unspecified, unspecified trimester: Secondary | ICD-10-CM

## 2017-05-13 DIAGNOSIS — Z98891 History of uterine scar from previous surgery: Secondary | ICD-10-CM

## 2017-05-13 NOTE — Progress Notes (Signed)
    PRENATAL VISIT NOTE  Subjective:  April Gregory is a 37 y.o. [phone] at 3315w3d being seen today for ongoing prenatal care.  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  Patient reports no complaints.  Contractions: Not present. Vag. Bleeding: None.  Movement: Present. Denies leaking of fluid.   The following portions of the patient's history were reviewed and updated as appropriate: allergies, current medications, past family history, past medical history, past social history, past surgical history and problem list. Problem list updated.  Objective:   Vitals:   05/13/17 1344  BP: 124/66  Pulse: 91  Weight: (!) 370 lb 3.2 oz (167.9 kg)    Fetal Status: Fetal Heart Rate (bpm): 152; doppler Fundal Height: 32 cm Movement: Present     General:  Alert, oriented and cooperative. Patient is in no acute distress.  Skin: Skin is warm and dry. No rash noted.   Cardiovascular: Normal heart rate noted  Respiratory: Normal respiratory effort, no problems with respiration noted  Abdomen: Soft, gravid, appropriate for gestational age.  Pain/Pressure: Absent     Pelvic: Cervical exam deferred        Extremities: Normal range of motion.  Edema: Deep pitting, indentation remains for a short time  Mental Status:  Normal mood and affect. Normal behavior. Normal judgment and thought content.   Assessment and Plan:  Pregnancy: [id number] at 7715w3d  1. Supervision of high risk pregnancy, antepartum     Doing well - US MFM FETAL BPP WO NON STRESS; Future  2. Chronic hypertension  during pregnancy, antepartum     Normotensive today.  Not on meds. - US MFM FETAL BPP WO NON STRESS; Future  3. History of C-section      Repeat C-section   Preterm labor symptoms and general obstetric precautions including but not limited to vaginal bleeding, contractions, leaking of fluid and fetal movement were reviewed in detail with the patient. Please refer to After Visit Summary for other counseling recommendations.  [facility] about 3 weeks (around 06[facility]03/2017[facility]   Roe Coombsachelle A Willim Turnage, CNM

## 2017-05-13 NOTE — Patient Instructions (Addendum)
Urinary Tract Infection, Adult A urinary tract infection (UTI) is an infection of any part of the urinary tract. The urinary tract includes the:  Kidneys.  Ureters.  Bladder.  Urethra.  These organs make, store, and get rid of pee (urine) in the body. Follow these instructions at home:  Take over-the-counter and prescription medicines only as told by your doctor.  If you were prescribed an antibiotic medicine, take it as told by your doctor. Do not stop taking the antibiotic even if you start to feel better.  Avoid the following drinks: ? Alcohol. ? Caffeine. ? Tea. ? Carbonated drinks.  Drink enough fluid to keep your pee clear or pale yellow.  Keep all follow-up visits as told by your doctor. This is important.  Make sure to: ? Empty your bladder often and completely. Do not to hold pee for long periods of time. ? Empty your bladder before and after sex. ? Wipe from front to back after a bowel movement if you are female. Use each tissue one time when you wipe. Contact a doctor if:  You have back pain.  You have a fever.  You feel sick to your stomach (nauseous).  You throw up (vomit).  Your symptoms do not get better after 3 days.  Your symptoms go away and then come back. Get help right away if:  You have very bad back pain.  You have very bad lower belly (abdominal) pain.  You are throwing up and cannot keep down any medicines or water. This information is not intended to replace advice given to you by your health care provider. Make sure you discuss any questions you have with your health care provider. Document Released: 09/24/2007 Document Revised: 09/13/2015 Document Reviewed: 02/26/2015 Elsevier Interactive Patient Education  2018 [employer]  Preterm Labor and Birth Information Pregnancy normally lasts 39-41 weeks. Preterm labor is when labor starts early. It starts before you have been pregnant for 37 whole weeks. What are the risk factors for  preterm labor? Preterm labor is more likely to occur in women who:  Have an infection while pregnant.  Have a cervix that is short.  Have gone into preterm labor before.  Have had surgery on their cervix.  Are younger than age 35.  Are older than age 35.  Are African American.  Are pregnant with two or more babies.  Take street drugs while pregnant.  Smoke while pregnant.  Do not gain enough weight while pregnant.  Got pregnant right after another pregnancy.  What are the symptoms of preterm labor? Symptoms of preterm labor include:  Cramps. The cramps may feel like the cramps some women get during their period. The cramps may happen with watery poop (diarrhea).  Pain in the belly (abdomen).  Pain in the lower back.  Regular contractions or tightening. It may feel like your belly is getting tighter.  Pressure in the lower belly that seems to get stronger.  More fluid (discharge) leaking from the vagina. The fluid may be watery or bloody.  Water breaking.  Why is it important to notice signs of preterm labor? Babies who are born early may not be fully developed. They have a higher chance for:  Long-term heart problems.  Long-term lung problems.  Trouble controlling body systems, like breathing.  Bleeding in the brain.  A condition called cerebral palsy.  Learning difficulties.  Death.  These risks are highest for babies who are born before 34 weeks of pregnancy. How is preterm labor treated?  Treatment depends on:  How long you were pregnant.  Your condition.  The health of your baby.  Treatment may involve:  Having a stitch (suture) placed in your cervix. When you give birth, your cervix opens so the baby can come out. The stitch keeps the cervix from opening too soon.  Staying at the hospital.  Taking or getting medicines, such as: ? Hormone medicines. ? Medicines to stop contractions. ? Medicines to help the baby's lungs  develop. ? Medicines to prevent your baby from having cerebral palsy.  What should I do if I am in preterm labor? If you think you are going into labor too soon, call your doctor right away. How can I prevent preterm labor?  Do not use any tobacco products. ? Examples of these are cigarettes, chewing tobacco, and e-cigarettes. ? If you need help quitting, ask your doctor.  Do not use street drugs.  Do not use any medicines unless you ask your doctor if they are safe for you.  Talk with your doctor before taking any herbal supplements.  Make sure you gain enough weight.  Watch for infection. If you think you might have an infection, get it checked right away.  If you have gone into preterm labor before, tell your doctor. This information is not intended to replace advice given to you by your health care provider. Make sure you discuss any questions you have with your health care provider. Document Released: 07/04/2008 Document Revised: 09/18/2015 Document Reviewed: 08/29/2015 Elsevier Interactive Patient Education  2018 [employer].  Preeclampsia and Eclampsia Preeclampsia is a serious condition that develops only during pregnancy. It is also called toxemia of pregnancy. This condition causes high blood pressure along with other symptoms, such as swelling and headaches. These symptoms may develop as the condition gets worse. Preeclampsia may occur at 20 weeks of pregnancy or later. Diagnosing and treating preeclampsia early is very important. If not treated early, it can cause serious problems for you and your baby. One problem it can lead to is eclampsia, which is a condition that causes muscle jerking or shaking (convulsions or seizures) in the mother. Delivering your baby is the best treatment for preeclampsia or eclampsia. Preeclampsia and eclampsia symptoms usually go away after your baby is born. What are the causes? The cause of preeclampsia is not known. What increases the  risk? The following risk factors make you more likely to develop preeclampsia:  Being pregnant for the first time.  Having had preeclampsia during a past pregnancy.  Having a family history of preeclampsia.  Having high blood pressure.  Being pregnant with twins or triplets.  Being 20 or older.  Being African-American.  Having kidney disease or diabetes.  Having medical conditions such as lupus or blood diseases.  Being very overweight (obese).  What are the signs or symptoms? The earliest signs of preeclampsia are:  High blood pressure.  Increased protein in your urine. Your health care provider will check for this at every visit before you give birth (prenatal visit).  Other symptoms that may develop as the condition gets worse include:  Severe headaches.  Sudden weight gain.  Swelling of the hands, face, legs, and feet.  Nausea and vomiting.  Vision problems, such as blurred or double vision.  Numbness in the face, arms, legs, and feet.  Urinating less than usual.  Dizziness.  Slurred speech.  Abdominal pain, especially upper abdominal pain.  Convulsions or seizures.  Symptoms generally go away after giving birth. How is  this diagnosed? There are no screening tests for preeclampsia. Your health care provider will ask you about symptoms and check for signs of preeclampsia during your prenatal visits. You may also have tests that include:  Urine tests.  Blood tests.  Checking your blood pressure.  Monitoring your baby's heart rate.  Ultrasound.  How is this treated? You and your health care provider will determine the treatment approach that is best for you. Treatment may include:  Having more frequent prenatal exams to check for signs of preeclampsia, if you have an increased risk for preeclampsia.  Bed rest.  Reducing how much salt (sodium) you eat.  Medicine to lower your blood pressure.  Staying in the hospital, if your condition is  severe. There, treatment will focus on controlling your blood pressure and the amount of fluids in your body (fluid retention).  You may need to take medicine (magnesium sulfate) to prevent seizures. This medicine may be given as an injection or through an IV tube.  Delivering your baby early, if your condition gets worse. You may have your labor started with medicine (induced), or you may have a cesarean delivery.  Follow these instructions at home: Eating and drinking   Drink enough fluid to keep your urine clear or pale yellow.  Eat a healthy diet that is low in sodium. Do not add salt to your food. Check nutrition labels to see how much sodium a food or beverage contains.  Avoid caffeine. Lifestyle  Do not use any products that contain nicotine or tobacco, such as cigarettes and e-cigarettes. If you need help quitting, ask your health care provider.  Do not use alcohol or drugs.  Avoid stress as much as possible. Rest and get plenty of sleep. General instructions  Take over-the-counter and prescription medicines only as told by your health care provider.  When lying down, lie on your side. This keeps pressure off of your baby.  When sitting or lying down, raise (elevate) your feet. Try putting some pillows underneath your lower legs.  Exercise regularly. Ask your health care provider what kinds of exercise are best for you.  Keep all follow-up and prenatal visits as told by your health care provider. This is important. How is this prevented? To prevent preeclampsia or eclampsia from developing during another pregnancy:  Get proper medical care during pregnancy. Your health care provider may be able to prevent preeclampsia or diagnose and treat it early.  Your health care provider may have you take a low-dose aspirin or a calcium supplement during your next pregnancy.  You may have tests of your blood pressure and kidney function after giving birth.  Maintain a healthy  weight. Ask your health care provider for help managing weight gain during pregnancy.  Work with your health care provider to manage any long-term (chronic) health conditions you have, such as diabetes or kidney problems.  Contact a health care provider if:  You gain more weight than expected.  You have headaches.  You have nausea or vomiting.  You have abdominal pain.  You feel dizzy or light-headed. Get help right away if:  You develop sudden or severe swelling anywhere in your body. This usually happens in the legs.  You gain 5 lbs (2.3 kg) or more during one week.  You have severe: ? Abdominal pain. ? Headaches. ? Dizziness. ? Vision problems. ? Confusion. ? Nausea or vomiting.  You have a seizure.  You have trouble moving any part of your body.  You develop  numbness in any part of your body.  You have trouble speaking.  You have any abnormal bleeding.  You pass out. This information is not intended to replace advice given to you by your health care provider. Make sure you discuss any questions you have with your health care provider. Document Released: 04/04/2000 Document Revised: 12/04/2015 Document Reviewed: 11/12/2015 Elsevier Interactive Patient Education  2018 [employer].  Hypertension During Pregnancy Hypertension is also called high blood pressure. High blood pressure means that the force of your blood moving in your body is too strong. When you are pregnant, this condition should be watched carefully. It can cause problems for you and your baby. Follow these instructions at home: Eating and drinking  Drink enough fluid to keep your pee (urine) clear or pale yellow.  Eat healthy foods that are low in salt (sodium). ? Do not add salt to your food. ? Check labels on foods and drinks to see much salt is in them. Look on the label where you see "Sodium." Lifestyle  Do not use any products that contain nicotine or tobacco, such as cigarettes and  e-cigarettes. If you need help quitting, ask your doctor.  Do not use alcohol.  Avoid caffeine.  Avoid stress. Rest and get plenty of sleep. General instructions  Take over-the-counter and prescription medicines only as told by your doctor.  While lying down, lie on your left side. This keeps pressure off your baby.  While sitting or lying down, raise (elevate) your feet. Try putting some pillows under your lower legs.  Exercise regularly. Ask your doctor what kinds of exercise are best for you.  Keep all prenatal and follow-up visits as told by your doctor. This is important. Contact a doctor if:  You have symptoms that your doctor told you to watch for, such as: ? Fever. ? Throwing up (vomiting). ? Headache. Get help right away if:  You have very bad pain in your belly (abdomen).  You are throwing up, and this does not get better with treatment.  You suddenly get swelling in your hands, ankles, or face.  You gain 4 lb (1.8 kg) or more in 1 week.  You get bleeding from your vagina.  You have blood in your pee.  You do not feel your baby moving as much as normal.  You have a change in vision.  You have muscle twitching or sudden tightening (spasms).  You have trouble breathing.  Your lips or fingernails turn blue. This information is not intended to replace advice given to you by your health care provider. Make sure you discuss any questions you have with your health care provider. Document Released: 05/10/2010 Document Revised: 12/18/2015 Document Reviewed: 12/18/2015 Elsevier Interactive Patient Education  2018 [employer].  Third Trimester of Pregnancy The third trimester is from week 28 through week 40 (months 7 through 9). The third trimester is a time when the unborn baby (fetus) is growing rapidly. At the end of the ninth month, the fetus is about 20 inches in length and weighs 6-10 pounds. Body changes during your third trimester Your body will continue  to go through many changes during pregnancy. The changes vary from woman to woman. During the third trimester:  Your weight will continue to increase. You can expect to gain 25-35 pounds (11-16 kg) by the end of the pregnancy.  You may begin to get stretch marks on your hips, abdomen, and breasts.  You may urinate more often because the fetus is moving  lower into your pelvis and pressing on your bladder.  You may develop or continue to have heartburn. This is caused by increased hormones that slow down muscles in the digestive tract.  You may develop or continue to have constipation because increased hormones slow digestion and cause the muscles that push waste through your intestines to relax.  You may develop hemorrhoids. These are swollen veins (varicose veins) in the rectum that can itch or be painful.  You may develop swollen, bulging veins (varicose veins) in your legs.  You may have increased body aches in the pelvis, back, or thighs. This is due to weight gain and increased hormones that are relaxing your joints.  You may have changes in your hair. These can include thickening of your hair, rapid growth, and changes in texture. Some women also have hair loss during or after pregnancy, or hair that feels dry or thin. Your hair will most likely return to normal after your baby is born.  Your breasts will continue to grow and they will continue to become tender. A yellow fluid (colostrum) may leak from your breasts. This is the first milk you are producing for your baby.  Your belly button may stick out.  You may notice more swelling in your hands, face, or ankles.  You may have increased tingling or numbness in your hands, arms, and legs. The skin on your belly may also feel numb.  You may feel short of breath because of your expanding uterus.  You may have more problems sleeping. This can be caused by the size of your belly, increased need to urinate, and an increase in your  body's metabolism.  You may notice the fetus "dropping," or moving lower in your abdomen (lightening).  You may have increased vaginal discharge.  You may notice your joints feel loose and you may have pain around your pelvic bone.  What to expect at prenatal visits You will have prenatal exams every 2 weeks until week 36. Then you will have weekly prenatal exams. During a routine prenatal visit:  You will be weighed to make sure you and the baby are growing normally.  Your blood pressure will be taken.  Your abdomen will be measured to track your baby's growth.  The fetal heartbeat will be listened to.  Any test results from the previous visit will be discussed.  You may have a cervical check near your due date to see if your cervix has softened or thinned (effaced).  You will be tested for Group B streptococcus. This happens between 35 and 37 weeks.  Your health care provider may ask you:  What your birth plan is.  How you are feeling.  If you are feeling the baby move.  If you have had any abnormal symptoms, such as leaking fluid, bleeding, severe headaches, or abdominal cramping.  If you are using any tobacco products, including cigarettes, chewing tobacco, and electronic cigarettes.  If you have any questions.  Other tests or screenings that may be performed during your third trimester include:  Blood tests that check for low iron levels (anemia).  Fetal testing to check the health, activity level, and growth of the fetus. Testing is done if you have certain medical conditions or if there are problems during the pregnancy.  Nonstress test (NST). This test checks the health of your baby to make sure there are no signs of problems, such as the baby not getting enough oxygen. During this test, a belt is placed around  your belly. The baby is made to move, and its heart rate is monitored during movement.  What is false labor? False labor is a condition in which you  feel small, irregular tightenings of the muscles in the womb (contractions) that usually go away with rest, changing position, or drinking water. These are called Braxton Hicks contractions. Contractions may last for hours, days, or even weeks before true labor sets in. If contractions come at regular intervals, become more frequent, increase in intensity, or become painful, you should see your health care provider. What are the signs of labor?  Abdominal cramps.  Regular contractions that start at 10 minutes apart and become stronger and more frequent with time.  Contractions that start on the top of the uterus and spread down to the lower abdomen and back.  Increased pelvic pressure and dull back pain.  A watery or bloody mucus discharge that comes from the vagina.  Leaking of amniotic fluid. This is also known as your "water breaking." It could be a slow trickle or a gush. Let your health care provider know if it has a color or strange odor. If you have any of these signs, call your health care provider right away, even if it is before your due date. Follow these instructions at home: Medicines  Follow your health care provider's instructions regarding medicine use. Specific medicines may be either safe or unsafe to take during pregnancy.  Take a prenatal vitamin that contains at least 600 micrograms (mcg) of folic acid.  If you develop constipation, try taking a stool softener if your health care provider approves. Eating and drinking  Eat a balanced diet that includes fresh fruits and vegetables, whole grains, good sources of protein such as meat, eggs, or tofu, and low-fat dairy. Your health care provider will help you determine the amount of weight gain that is right for you.  Avoid raw meat and uncooked cheese. These carry germs that can cause birth defects in the baby.  If you have low calcium intake from food, talk to your health care provider about whether you should take a  daily calcium supplement.  Eat four or five small meals rather than three large meals a day.  Limit foods that are high in fat and processed sugars, such as fried and sweet foods.  To prevent constipation: ? Drink enough fluid to keep your urine clear or pale yellow. ? Eat foods that are high in fiber, such as fresh fruits and vegetables, whole grains, and beans. Activity  Exercise only as directed by your health care provider. Most women can continue their usual exercise routine during pregnancy. Try to exercise for 30 minutes at least 5 days a week. Stop exercising if you experience uterine contractions.  Avoid heavy lifting.  Do not exercise in extreme heat or humidity, or at high altitudes.  Wear low-heel, comfortable shoes.  Practice good posture.  You may continue to have sex unless your health care provider tells you otherwise. Relieving pain and discomfort  Take frequent breaks and rest with your legs elevated if you have leg cramps or low back pain.  Take warm sitz baths to soothe any pain or discomfort caused by hemorrhoids. Use hemorrhoid cream if your health care provider approves.  Wear a good support bra to prevent discomfort from breast tenderness.  If you develop varicose veins: ? Wear support pantyhose or compression stockings as told by your healthcare provider. ? Elevate your feet for 15 minutes, 3-4 times a day.  Prenatal care  Write down your questions. Take them to your prenatal visits.  Keep all your prenatal visits as told by your health care provider. This is important. Safety  Wear your seat belt at all times when driving.  Make a list of emergency phone numbers, including numbers for family, friends, the hospital, and police and fire departments. General instructions  Avoid cat litter boxes and soil used by cats. These carry germs that can cause birth defects in the baby. If you have a cat, ask someone to clean the litter box for you.  Do not  travel far distances unless it is absolutely necessary and only with the approval of your health care provider.  Do not use hot tubs, steam rooms, or saunas.  Do not drink alcohol.  Do not use any products that contain nicotine or tobacco, such as cigarettes and e-cigarettes. If you need help quitting, ask your health care provider.  Do not use any medicinal herbs or unprescribed drugs. These chemicals affect the formation and growth of the baby.  Do not douche or use tampons or scented sanitary pads.  Do not cross your legs for long periods of time.  To prepare for the arrival of your baby: ? Take prenatal classes to understand, practice, and ask questions about labor and delivery. ? Make a trial run to the hospital. ? Visit the hospital and tour the maternity area. ? Arrange for maternity or paternity leave through employers. ? Arrange for family and friends to take care of pets while you are in the hospital. ? Purchase a rear-facing car seat and make sure you know how to install it in your car. ? Pack your hospital bag. ? Prepare the baby's nursery. Make sure to remove all pillows and stuffed animals from the baby's crib to prevent suffocation.  Visit your dentist if you have not gone during your pregnancy. Use a soft toothbrush to brush your teeth and be gentle when you floss. Contact a health care provider if:  You are unsure if you are in labor or if your water has broken.  You become dizzy.  You have mild pelvic cramps, pelvic pressure, or nagging pain in your abdominal area.  You have lower back pain.  You have persistent nausea, vomiting, or diarrhea.  You have an unusual or bad smelling vaginal discharge.  You have pain when you urinate. Get help right away if:  Your water breaks before 37 weeks.  You have regular contractions less than 5 minutes apart before 37 weeks.  You have a fever.  You are leaking fluid from your vagina.  You have spotting or  bleeding from your vagina.  You have severe abdominal pain or cramping.  You have rapid weight loss or weight gain.  You have shortness of breath with chest pain.  You notice sudden or extreme swelling of your face, hands, ankles, feet, or legs.  Your baby makes fewer than 10 movements in 2 hours.  You have severe headaches that do not go away when you take medicine.  You have vision changes. Summary  The third trimester is from week 28 through week 40, months 7 through 9. The third trimester is a time when the unborn baby (fetus) is growing rapidly.  During the third trimester, your discomfort may increase as you and your baby continue to gain weight. You may have abdominal, leg, and back pain, sleeping problems, and an increased need to urinate.  During the third trimester your breasts will  keep growing and they will continue to become tender. A yellow fluid (colostrum) may leak from your breasts. This is the first milk you are producing for your baby.  False labor is a condition in which you feel small, irregular tightenings of the muscles in the womb (contractions) that eventually go away. These are called Braxton Hicks contractions. Contractions may last for hours, days, or even weeks before true labor sets in.  Signs of labor can include: abdominal cramps; regular contractions that start at 10 minutes apart and become stronger and more frequent with time; watery or bloody mucus discharge that comes from the vagina; increased pelvic pressure and dull back pain; and leaking of amniotic fluid. This information is not intended to replace advice given to you by your health care provider. Make sure you discuss any questions you have with your health care provider. Document Released: 04/01/2001 Document Revised: 09/13/2015 Document Reviewed: 06/08/2012 Elsevier Interactive Patient Education  2017 [employer] Glucose Tolerance Test During Pregnancy The glucose tolerance test (GTT) is a  blood test used to determine if you have developed a type of diabetes during pregnancy (gestational diabetes). This is when your body does not properly process sugar (glucose) in the food you eat, resulting in high blood glucose levels. Typically, a GTT is done after you have had a 1-hour glucose test with results that indicate you possibly have gestational diabetes. It may also be done if:  You have a history of giving birth to very large babies or have experienced repeated fetal loss (stillbirth).  You have signs and symptoms of diabetes, such as: ? Changes in your vision. ? Tingling or numbness in your hands or feet. ? Changes in hunger, thirst, and urination not otherwise explained by your pregnancy.  The GTT lasts about 3 hours. You will be given a sugar-water solution to drink at the beginning of the test. You will have blood drawn before you drink the solution and then again 1, 2, and 3 hours after you drink it. You will not be allowed to eat or drink anything else during the test. You must remain at the testing location to make sure that your blood is drawn on time. You should also avoid exercising during the test, because exercise can alter test results. How do I prepare for this test? Eat normally for 3 days prior to the GTT test, including having plenty of carbohydrate-rich foods. Do not eat or drink anything except water during the final 12 hours before the test. In addition, your health care provider may ask you to stop taking certain medicines before the test. What do the results mean? It is your responsibility to obtain your test results. Ask the lab or department performing the test when and how you will get your results. Contact your health care provider to discuss any questions you have about your results. Range of Normal Values Ranges for normal values may vary among different labs and hospitals. You should always check with your health care provider after having lab work or other  tests done to discuss whether your values are considered within normal limits. Normal levels of blood glucose are as follows:  Fasting: less than 105 mg/dL.  1 hour after drinking the solution: less than 190 mg/dL.  2 hours after drinking the solution: less than 165 mg/dL.  3 hours after drinking the solution: less than 145 mg/dL.  Some substances can interfere with GTT results. These may include:  Blood pressure and heart failure medicines, including beta  blockers, furosemide, and thiazides.  Anti-inflammatory medicines, including aspirin.  Nicotine.  Some psychiatric medicines.  Meaning of Results Outside Normal Value Ranges GTT test results that are above normal values may indicate a number of health problems, such as:  Gestational diabetes.  Acute stress response.  Cushing syndrome.  Tumors such as pheochromocytoma or glucagonoma.  Long-term kidney problems.  Pancreatitis.  Hyperthyroidism.  Current infection.  Discuss your test results with your health care provider. He or she will use the results to make a diagnosis and determine a treatment plan that is right for you. This information is not intended to replace advice given to you by your health care provider. Make sure you discuss any questions you have with your health care provider. Document Released: 10/07/2011 Document Revised: 09/13/2015 Document Reviewed: 08/12/2013 Elsevier Interactive Patient Education  [employer].

## 2017-05-13 NOTE — Progress Notes (Signed)
Pt was seen at [facility] 04/23/17-04-26-2017 for a kidney Infection.

## 2017-05-26 ENCOUNTER — Encounter (HOSPITAL_COMMUNITY): Payer: Self-pay

## 2017-06-03 ENCOUNTER — Encounter: Payer: Self-pay | Admitting: Certified Nurse Midwife

## 2017-06-03 ENCOUNTER — Other Ambulatory Visit: Payer: Self-pay | Admitting: Certified Nurse Midwife

## 2017-06-03 ENCOUNTER — Ambulatory Visit (HOSPITAL_COMMUNITY)
Admission: RE | Admit: 2017-06-03 | Discharge: 2017-06-03 | Disposition: A | Discharge status: Home / Self Care | Payer: 59 | Source: Ambulatory Visit | Attending: Certified Nurse Midwife | Admitting: Certified Nurse Midwife

## 2017-06-03 ENCOUNTER — Ambulatory Visit (INDEPENDENT_AMBULATORY_CARE_PROVIDER_SITE_OTHER): Payer: 59 | Admitting: Certified Nurse Midwife

## 2017-06-03 ENCOUNTER — Other Ambulatory Visit: Payer: 59

## 2017-06-03 ENCOUNTER — Other Ambulatory Visit (HOSPITAL_COMMUNITY): Payer: Self-pay | Admitting: Obstetrics and Gynecology

## 2017-06-03 ENCOUNTER — Other Ambulatory Visit: Payer: Self-pay

## 2017-06-03 VITALS — BP 121/78 | HR 91 | Wt 366.0 lb

## 2017-06-03 DIAGNOSIS — O34219 Maternal care for unspecified type scar from previous cesarean delivery: Secondary | ICD-10-CM

## 2017-06-03 DIAGNOSIS — O0990 Supervision of high risk pregnancy, unspecified, unspecified trimester: Secondary | ICD-10-CM

## 2017-06-03 DIAGNOSIS — O10013 Pre-existing essential hypertension complicating pregnancy, third trimester: Secondary | ICD-10-CM | POA: Diagnosis not present

## 2017-06-03 DIAGNOSIS — O0993 Supervision of high risk pregnancy, unspecified, third trimester: Secondary | ICD-10-CM | POA: Insufficient documentation

## 2017-06-03 DIAGNOSIS — Z3A28 28 weeks gestation of pregnancy: Secondary | ICD-10-CM

## 2017-06-03 DIAGNOSIS — O99213 Obesity complicating pregnancy, third trimester: Secondary | ICD-10-CM

## 2017-06-03 DIAGNOSIS — F329 Major depressive disorder, single episode, unspecified: Secondary | ICD-10-CM

## 2017-06-03 DIAGNOSIS — O10919 Unspecified pre-existing hypertension complicating pregnancy, unspecified trimester: Secondary | ICD-10-CM

## 2017-06-03 DIAGNOSIS — F32A Depression, unspecified: Secondary | ICD-10-CM | POA: Insufficient documentation

## 2017-06-03 DIAGNOSIS — O10913 Unspecified pre-existing hypertension complicating pregnancy, third trimester: Secondary | ICD-10-CM | POA: Diagnosis not present

## 2017-06-03 DIAGNOSIS — F419 Anxiety disorder, unspecified: Secondary | ICD-10-CM

## 2017-06-03 DIAGNOSIS — O99340 Other mental disorders complicating pregnancy, unspecified trimester: Secondary | ICD-10-CM | POA: Insufficient documentation

## 2017-06-03 DIAGNOSIS — O99343 Other mental disorders complicating pregnancy, third trimester: Secondary | ICD-10-CM

## 2017-06-03 DIAGNOSIS — T7491XA Unspecified adult maltreatment, confirmed, initial encounter: Secondary | ICD-10-CM | POA: Insufficient documentation

## 2017-06-03 MED ORDER — SERTRALINE HCL 100 MG PO TABS: 100.0000 mg | ORAL_TABLET | Freq: Every day | ORAL | 12 refills | Status: DC

## 2017-06-03 MED ORDER — HYDROXYZINE PAMOATE 50 MG PO CAPS: 50.0000 mg | ORAL_CAPSULE | Freq: Three times a day (TID) | ORAL | 0 refills | Status: DC | PRN

## 2017-06-03 NOTE — Progress Notes (Signed)
ROB/GTT.  Complains of being depression. PHQ-9=17
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
## 2017-06-04 ENCOUNTER — Other Ambulatory Visit: Payer: Self-pay | Admitting: Certified Nurse Midwife

## 2017-06-04 DIAGNOSIS — O0990 Supervision of high risk pregnancy, unspecified, unspecified trimester: Secondary | ICD-10-CM

## 2017-06-04 LAB — CBC
HEMOGLOBIN: 11.2 g/dL (ref 11.1–15.9)
Hematocrit: 35 % (ref 34.0–46.6)
MCH: 23.4 pg — ABNORMAL LOW (ref 26.6–33.0)
MCHC: 32 g/dL (ref 31.5–35.7)
MCV: 73 fL — ABNORMAL LOW (ref 79–97)
Platelets: 298 10*3/uL (ref 150–379)
RBC: 4.79 x10E6/uL (ref 3.77–5.28)
RDW: 16.8 % — ABNORMAL HIGH (ref 12.3–15.4)
WBC: 7.4 10*3/uL (ref 3.4–10.8)

## 2017-06-04 LAB — RPR: RPR: NONREACTIVE

## 2017-06-04 LAB — GLUCOSE TOLERANCE, 2 HOURS W/ 1HR
GLUCOSE, 1 HOUR: 130 mg/dL (ref 65–179)
GLUCOSE, 2 HOUR: 61 mg/dL — AB (ref 65–152)
GLUCOSE, FASTING: 76 mg/dL (ref 65–91)

## 2017-06-04 LAB — HIV ANTIBODY (ROUTINE TESTING W REFLEX): HIV SCREEN 4TH GENERATION: NONREACTIVE

## 2017-06-12 ENCOUNTER — Other Ambulatory Visit: Payer: Self-pay

## 2017-06-12 ENCOUNTER — Encounter (HOSPITAL_COMMUNITY): Payer: Self-pay | Admitting: *Deleted

## 2017-06-12 ENCOUNTER — Inpatient Hospital Stay (HOSPITAL_COMMUNITY)
Admission: AD | Admit: 2017-06-12 | Discharge: 2017-06-12 | Disposition: A | Discharge status: Home / Self Care | Payer: 59 | Source: Ambulatory Visit | Attending: Obstetrics and Gynecology | Admitting: Obstetrics and Gynecology

## 2017-06-12 DIAGNOSIS — Z880 Allergy status to penicillin: Secondary | ICD-10-CM | POA: Insufficient documentation

## 2017-06-12 DIAGNOSIS — Z881 Allergy status to other antibiotic agents status: Secondary | ICD-10-CM | POA: Insufficient documentation

## 2017-06-12 DIAGNOSIS — O98813 Other maternal infectious and parasitic diseases complicating pregnancy, third trimester: Secondary | ICD-10-CM | POA: Diagnosis not present

## 2017-06-12 DIAGNOSIS — B373 Candidiasis of vulva and vagina: Secondary | ICD-10-CM | POA: Insufficient documentation

## 2017-06-12 DIAGNOSIS — R109 Unspecified abdominal pain: Secondary | ICD-10-CM | POA: Diagnosis present

## 2017-06-12 DIAGNOSIS — Z3A29 29 weeks gestation of pregnancy: Secondary | ICD-10-CM | POA: Diagnosis not present

## 2017-06-12 DIAGNOSIS — O99343 Other mental disorders complicating pregnancy, third trimester: Secondary | ICD-10-CM | POA: Diagnosis not present

## 2017-06-12 DIAGNOSIS — Z882 Allergy status to sulfonamides status: Secondary | ICD-10-CM | POA: Diagnosis not present

## 2017-06-12 DIAGNOSIS — O26893 Other specified pregnancy related conditions, third trimester: Secondary | ICD-10-CM | POA: Diagnosis present

## 2017-06-12 DIAGNOSIS — O09893 Supervision of other high risk pregnancies, third trimester: Secondary | ICD-10-CM | POA: Diagnosis not present

## 2017-06-12 DIAGNOSIS — O2343 Unspecified infection of urinary tract in pregnancy, third trimester: Secondary | ICD-10-CM | POA: Diagnosis not present

## 2017-06-12 DIAGNOSIS — O98819 Other maternal infectious and parasitic diseases complicating pregnancy, unspecified trimester: Secondary | ICD-10-CM

## 2017-06-12 LAB — URINALYSIS, ROUTINE W REFLEX MICROSCOPIC
Bacteria, UA: NONE SEEN
Bilirubin Urine: NEGATIVE
GLUCOSE, UA: NEGATIVE mg/dL
KETONES UR: NEGATIVE mg/dL
NITRITE: POSITIVE — AB
PROTEIN: 30 mg/dL — AB
Specific Gravity, Urine: 1.021 (ref 1.005–1.030)
pH: 6 (ref 5.0–8.0)

## 2017-06-12 LAB — WET PREP, GENITAL
Sperm: NONE SEEN
Trich, Wet Prep: NONE SEEN

## 2017-06-12 MED ORDER — TERCONAZOLE 0.4 % VA CREA: 1.0000 | TOPICAL_CREAM | Freq: Every day | VAGINAL | 0 refills | Status: DC

## 2017-06-12 MED ORDER — PHENAZOPYRIDINE HCL 200 MG PO TABS: 200.0000 mg | ORAL_TABLET | Freq: Three times a day (TID) | ORAL | 0 refills | Status: AC

## 2017-06-12 MED ORDER — CEPHALEXIN 500 MG PO CAPS: 500.0000 mg | ORAL_CAPSULE | Freq: Four times a day (QID) | ORAL | 0 refills | Status: DC

## 2017-06-12 MED ORDER — PHENAZOPYRIDINE HCL 200 MG PO TABS: 200.0000 mg | ORAL_TABLET | Freq: Three times a day (TID) | ORAL | 0 refills | Status: DC

## 2017-06-12 NOTE — Discharge Instructions (Signed)

## 2017-06-12 NOTE — [facility] Provider Note (Signed)
History     CSN: [id number]  Arrival date and time: 06/12/17 4098   First Provider Initiated Contact with Patient 06/12/17 2014     Chief Complaint  Patient presents with  . Abdominal Pain   HPI April Gregory is a 37 y.o. [phone] at [redacted]w[redacted]d who presents with vaginal discharge. She states she switched soaps and now thinks she has a yeast infection. She also reports burning with urination and is concerned she may have another UTI. She reports feeling pressure in her pelvis but denies contractions. Denies vaginal bleeding or leaking of fluid. Reports good fetal movement. No history of preterm labor.   OB History    Gravida Para Term Preterm AB Living   4 3 3     3    SAB TAB Ectopic Multiple Live Births           3      Past Medical History:  Diagnosis Date  . Anemia   . Anxiety   . Infection    UTI  . MRSA (methicillin resistant staph aureus) culture positive   . Pregnancy induced hypertension   . Spinal headache   . Vaginal Pap smear, abnormal     Past Surgical History:  Procedure Laterality Date  . CESAREAN SECTION    . COLPOSCOPY      Family History  Problem Relation Age of Onset  . Cancer Mother   . Hypertension Mother   . Arthritis Mother   . Learning disabilities Son   . Arthritis Maternal Grandmother   . Cancer Maternal Grandmother     Social History   Tobacco Use  . Smoking status: Never Smoker  . Smokeless tobacco: Never Used  Substance Use Topics  . Alcohol use: No    Alcohol/week: 0.0 oz    Comment: social  . Drug use: No    Allergies:  Allergies  Allergen Reactions  . Flagyl [Metronidazole] Hives  . Other Shortness Of Breath    Pt allergic to Salmon, causes SOB and itching. Pt also allergic to ant bites causes itching, swelling, hives.  . Bactrim [Sulfamethoxazole-Trimethoprim] Itching  . Penicillins Other (See Comments)    Has patient had a PCN reaction causing immediate rash, facial/tongue/throat swelling, SOB or lightheadedness  with hypotension: No Has patient had a PCN reaction causing severe rash involving mucus membranes or skin necrosis: No Has patient had a PCN reaction that required hospitalization No Has patient had a PCN reaction occurring within the last 10 years:yes...5-6 years ago If all of the above answers are "NO", then may proceed with Cephalosporin use.    Medications Prior to Admission  Medication Sig Dispense Refill Last Dose  . Prenatal MV-Min-FA-Omega-3 (PRENATAL GUMMIES/DHA & FA PO) Take by mouth.   06/12/2017 at Unknown time  . hydrOXYzine (VISTARIL) 50 MG capsule Take 1 capsule (50 mg total) by mouth 3 (three) times daily as needed. 30 capsule 0 Unknown at Unknown time  . sertraline (ZOLOFT) 100 MG tablet Take 1 tablet (100 mg total) by mouth daily. 30 tablet 12     Review of Systems  Constitutional: Negative.  Negative for fatigue and fever.  HENT: Negative.   Respiratory: Negative.  Negative for shortness of breath.   Cardiovascular: Negative.  Negative for chest pain.  Gastrointestinal: Negative.  Negative for abdominal pain, constipation, diarrhea, nausea and vomiting.  Genitourinary: Positive for dysuria, vaginal discharge and vaginal pain.  Neurological: Negative.  Negative for dizziness and headaches.   Physical Exam   Blood pressure  126/73, pulse 86, temperature (!) 97.5 F (36.4 C), resp. rate 18, height 5\' 11"  (1.803 m), weight (!) 373 lb (169.2 kg), last menstrual period 11/01/2016.  Physical Exam  Nursing note and vitals reviewed. Constitutional: She is oriented to person, place, and time. She appears well-developed and well-nourished. No distress.  HENT:  Head: Normocephalic.  Eyes: Pupils are equal, round, and reactive to light.  Cardiovascular: Normal rate, regular rhythm and normal heart sounds.  Respiratory: Effort normal and breath sounds normal. No respiratory distress.  GI: Soft. Bowel sounds are normal. She exhibits no distension. There is no tenderness.   Genitourinary: Vaginal discharge (copious adherent white discharge) found.  Neurological: She is alert and oriented to person, place, and time.  Skin: Skin is warm and dry.  Psychiatric: She has a normal mood and affect. Her behavior is normal. Judgment and thought content normal.   Dilation: Closed Effacement (%): Thick Cervical Position: Posterior Exam by:: Ma Hillock. Eduard Penkala CNM  Fetal Tracing:  Baseline: 135  Variability: moderate Accels: 10x10 Decels: none  Toco: none   [facility] Course  Procedures Results for orders placed or performed during the hospital encounter of 06/12/17 (from the past 24 hour(s))  Wet prep, genital     Status: Abnormal   Collection Time: 06/12/17  7:17 PM  Result Value Ref Range   Yeast Wet Prep HPF POC PRESENT (A) NONE SEEN   Trich, Wet Prep NONE SEEN NONE SEEN   Clue Cells Wet Prep HPF POC PRESENT (A) NONE SEEN   WBC, Wet Prep HPF POC MODERATE (A) NONE SEEN   Sperm NONE SEEN   Urinalysis, Routine w reflex microscopic     Status: Abnormal   Collection Time: 06/12/17  7:55 PM  Result Value Ref Range   Color, Urine AMBER (A) YELLOW   APPearance CLOUDY (A) CLEAR   Specific Gravity, Urine 1.021 1.005 - 1.030   pH 6.0 5.0 - 8.0   Glucose, UA NEGATIVE NEGATIVE mg/dL   Hgb urine dipstick LARGE (A) NEGATIVE   Bilirubin Urine NEGATIVE NEGATIVE   Ketones, ur NEGATIVE NEGATIVE mg/dL   Protein, ur 30 (A) NEGATIVE mg/dL   Nitrite POSITIVE (A) NEGATIVE   Leukocytes, UA MODERATE (A) NEGATIVE   RBC / HPF TOO NUMEROUS TO COUNT 0 - 5 RBC/hpf   WBC, UA TOO NUMEROUS TO COUNT 0 - 5 WBC/hpf   Bacteria, UA NONE SEEN NONE SEEN   Squamous Epithelial / LPF 0-5 (A) NONE SEEN   Mucus PRESENT    MDM UA Wet prep and gc/chlamydia  Assessment and Plan   1. Urinary tract infection in mother during third trimester of pregnancy   2. Candidiasis of vagina during pregnancy   3. [redacted] weeks gestation of pregnancy    -Discharge home in stable condition -Rx for keflex, pyridium  and terazole sent to patient's pharmacy -Preterm labor precautions discussed -Patient advised to follow-up with [facility] as scheduled for prenatal care on Wednesday. -Patient may return to [facility] as needed or if her condition were to change or worsen  Rolm BookbinderCaroline M Omare Bilotta  CNM 06/12/2017, 8:46 PM

## 2017-06-12 NOTE — [facility] Note (Signed)
Two days ago changed my soap and thought I may have had a yeast infection. Hurts where the urine comes from. Admitted for 6 days first of January with kidney infection. Sometimes feel like I have to poop but cannot. When pee comes out it really hurts. I saw a drop of blood on tissue but that is all and unsure where it came from

## 2017-06-14 LAB — CULTURE, OB URINE: Culture: NO GROWTH

## 2017-06-15 LAB — GC/CHLAMYDIA PROBE AMP (~~LOC~~) NOT AT ARMC
Chlamydia: NEGATIVE
Neisseria Gonorrhea: NEGATIVE

## 2017-06-17 ENCOUNTER — Encounter: Payer: 59 | Admitting: Obstetrics and Gynecology

## 2017-06-17 ENCOUNTER — Ambulatory Visit (INDEPENDENT_AMBULATORY_CARE_PROVIDER_SITE_OTHER): Payer: 59 | Admitting: Obstetrics and Gynecology

## 2017-06-17 ENCOUNTER — Encounter: Payer: Self-pay | Admitting: Obstetrics and Gynecology

## 2017-06-17 VITALS — BP 132/84 | HR 86 | Wt 377.7 lb

## 2017-06-17 DIAGNOSIS — O10919 Unspecified pre-existing hypertension complicating pregnancy, unspecified trimester: Secondary | ICD-10-CM | POA: Diagnosis not present

## 2017-06-17 DIAGNOSIS — O361910 Maternal care for other isoimmunization, first trimester, not applicable or unspecified: Secondary | ICD-10-CM

## 2017-06-17 DIAGNOSIS — Z98891 History of uterine scar from previous surgery: Secondary | ICD-10-CM

## 2017-06-17 DIAGNOSIS — O0990 Supervision of high risk pregnancy, unspecified, unspecified trimester: Secondary | ICD-10-CM
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
## 2017-06-18 NOTE — Addendum Note (Signed)
Addended by: Tim LairLARK, Dhairya Corales on: 06/18/2017 02:18 PM   Modules accepted: Orders

## 2017-07-01 ENCOUNTER — Encounter: Payer: Self-pay | Admitting: Obstetrics and Gynecology

## 2017-07-01 ENCOUNTER — Other Ambulatory Visit: Payer: Self-pay | Admitting: Obstetrics and Gynecology

## 2017-07-01 ENCOUNTER — Ambulatory Visit (HOSPITAL_COMMUNITY)
Admission: RE | Admit: 2017-07-01 | Discharge: 2017-07-01 | Disposition: A | Discharge status: Home / Self Care | Payer: 59 | Source: Ambulatory Visit | Attending: Obstetrics and Gynecology | Admitting: Obstetrics and Gynecology

## 2017-07-01 ENCOUNTER — Ambulatory Visit (INDEPENDENT_AMBULATORY_CARE_PROVIDER_SITE_OTHER): Payer: 59 | Admitting: Obstetrics and Gynecology

## 2017-07-01 VITALS — BP 131/90 | HR 92 | Wt 378.0 lb

## 2017-07-01 DIAGNOSIS — O163 Unspecified maternal hypertension, third trimester: Secondary | ICD-10-CM

## 2017-07-01 DIAGNOSIS — O34219 Maternal care for unspecified type scar from previous cesarean delivery: Secondary | ICD-10-CM

## 2017-07-01 DIAGNOSIS — Z98891 History of uterine scar from previous surgery: Secondary | ICD-10-CM | POA: Diagnosis not present

## 2017-07-01 DIAGNOSIS — Z362 Encounter for other antenatal screening follow-up: Secondary | ICD-10-CM

## 2017-07-01 DIAGNOSIS — O361933 Maternal care for other isoimmunization, third trimester, fetus 3: Secondary | ICD-10-CM

## 2017-07-01 DIAGNOSIS — O99213 Obesity complicating pregnancy, third trimester: Secondary | ICD-10-CM

## 2017-07-01 DIAGNOSIS — O09299 Supervision of pregnancy with other poor reproductive or obstetric history, unspecified trimester: Secondary | ICD-10-CM

## 2017-07-01 DIAGNOSIS — O10919 Unspecified pre-existing hypertension complicating pregnancy, unspecified trimester: Secondary | ICD-10-CM

## 2017-07-01 DIAGNOSIS — O09293 Supervision of pregnancy with other poor reproductive or obstetric history, third trimester: Secondary | ICD-10-CM | POA: Diagnosis not present

## 2017-07-01 DIAGNOSIS — O10913 Unspecified pre-existing hypertension complicating pregnancy, third trimester: Secondary | ICD-10-CM | POA: Diagnosis not present

## 2017-07-01 DIAGNOSIS — Z3A32 32 weeks gestation of pregnancy: Secondary | ICD-10-CM

## 2017-07-01 DIAGNOSIS — O0993 Supervision of high risk pregnancy, unspecified, third trimester: Secondary | ICD-10-CM

## 2017-07-01 DIAGNOSIS — O43213 Placenta accreta, third trimester: Secondary | ICD-10-CM | POA: Diagnosis not present

## 2017-07-01 DIAGNOSIS — O43219 Placenta accreta, unspecified trimester: Secondary | ICD-10-CM | POA: Insufficient documentation

## 2017-07-01 DIAGNOSIS — O10013 Pre-existing essential hypertension complicating pregnancy, third trimester: Secondary | ICD-10-CM | POA: Diagnosis not present

## 2017-07-01 DIAGNOSIS — O0990 Supervision of high risk pregnancy, unspecified, unspecified trimester: Secondary | ICD-10-CM

## 2017-07-01 DIAGNOSIS — O361930 Maternal care for other isoimmunization, third trimester, not applicable or unspecified: Secondary | ICD-10-CM
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
## 2017-07-03 ENCOUNTER — Telehealth: Payer: Self-pay

## 2017-07-08 ENCOUNTER — Encounter: Payer: 59 | Admitting: Obstetrics and Gynecology

## 2017-07-08 ENCOUNTER — Ambulatory Visit (HOSPITAL_COMMUNITY)
Admission: RE | Admit: 2017-07-08 | Discharge: 2017-07-08 | Disposition: A | Discharge status: Home / Self Care | Payer: 59 | Source: Ambulatory Visit | Attending: Maternal and Fetal Medicine | Admitting: Maternal and Fetal Medicine

## 2017-07-08 ENCOUNTER — Other Ambulatory Visit (HOSPITAL_COMMUNITY): Payer: Self-pay | Admitting: *Deleted

## 2017-07-08 DIAGNOSIS — O43213 Placenta accreta, third trimester: Secondary | ICD-10-CM | POA: Diagnosis not present

## 2017-07-08 MED ORDER — BETAMETHASONE SOD PHOS & ACET 6 (3-3) MG/ML IJ SUSP
12.0000 mg | Freq: Once | INTRAMUSCULAR | Status: AC
  Administered 2017-07-08: 12 mg via INTRAMUSCULAR
  Filled 2017-07-08: qty 2

## 2017-07-31 ENCOUNTER — Telehealth (HOSPITAL_COMMUNITY): Payer: Self-pay | Admitting: *Deleted

## 2017-07-31 NOTE — Telephone Encounter (Signed)
Preadmission screen  

## 2017-08-13 ENCOUNTER — Encounter (HOSPITAL_COMMUNITY): Admission: RE | Admit: 2017-08-13 | Payer: 59 | Source: Ambulatory Visit

## 2017-08-14 ENCOUNTER — Encounter (HOSPITAL_COMMUNITY): Admission: RE | Admit: 2017-08-14 | Payer: 59 | Source: Ambulatory Visit

## 2017-08-17 ENCOUNTER — Inpatient Hospital Stay (HOSPITAL_COMMUNITY): Admit: 2017-08-17 | Payer: 59 | Admitting: Obstetrics and Gynecology

## 2017-08-17 ENCOUNTER — Encounter (HOSPITAL_COMMUNITY): Payer: Self-pay

## 2017-08-17 SURGERY — Surgical Case
Anesthesia: Regional

## 2017-10-19 DEATH — deceased

## 2017-11-15 ENCOUNTER — Encounter (HOSPITAL_COMMUNITY): Payer: Self-pay

## 2017-12-15 ENCOUNTER — Encounter (HOSPITAL_COMMUNITY): Payer: Self-pay | Admitting: *Deleted

## 2017-12-15 ENCOUNTER — Inpatient Hospital Stay (HOSPITAL_COMMUNITY)
Admission: AD | Admit: 2017-12-15 | Discharge: 2017-12-15 | Disposition: A | Discharge status: Home / Self Care | Payer: 59 | Source: Ambulatory Visit | Attending: Obstetrics and Gynecology | Admitting: Obstetrics and Gynecology

## 2017-12-15 DIAGNOSIS — R350 Frequency of micturition: Secondary | ICD-10-CM | POA: Diagnosis present

## 2017-12-15 DIAGNOSIS — Z882 Allergy status to sulfonamides status: Secondary | ICD-10-CM | POA: Insufficient documentation

## 2017-12-15 DIAGNOSIS — N390 Urinary tract infection, site not specified: Secondary | ICD-10-CM | POA: Diagnosis present

## 2017-12-15 DIAGNOSIS — Z880 Allergy status to penicillin: Secondary | ICD-10-CM | POA: Insufficient documentation

## 2017-12-15 DIAGNOSIS — R112 Nausea with vomiting, unspecified: Secondary | ICD-10-CM | POA: Diagnosis not present

## 2017-12-15 DIAGNOSIS — F419 Anxiety disorder, unspecified: Secondary | ICD-10-CM | POA: Diagnosis not present

## 2017-12-15 DIAGNOSIS — R300 Dysuria: Secondary | ICD-10-CM | POA: Insufficient documentation

## 2017-12-15 DIAGNOSIS — N12 Tubulo-interstitial nephritis, not specified as acute or chronic: Secondary | ICD-10-CM | POA: Diagnosis not present

## 2017-12-15 DIAGNOSIS — Z881 Allergy status to other antibiotic agents status: Secondary | ICD-10-CM | POA: Insufficient documentation

## 2017-12-15 LAB — CBC WITH DIFFERENTIAL/PLATELET
BASOS ABS: 0 10*3/uL (ref 0.0–0.1)
BASOS PCT: 0 %
EOS PCT: 1 %
Eosinophils Absolute: 0.1 10*3/uL (ref 0.0–0.7)
HCT: 29 % — ABNORMAL LOW (ref 36.0–46.0)
HEMOGLOBIN: 8.7 g/dL — AB (ref 12.0–15.0)
Lymphocytes Relative: 28 %
Lymphs Abs: 2.7 10*3/uL (ref 0.7–4.0)
MCH: 18.5 pg — ABNORMAL LOW (ref 26.0–34.0)
MCHC: 30 g/dL (ref 30.0–36.0)
MCV: 61.6 fL — AB (ref 78.0–100.0)
MONOS PCT: 3 %
Monocytes Absolute: 0.3 10*3/uL (ref 0.1–1.0)
NEUTROS PCT: 68 %
Neutro Abs: 6.7 10*3/uL (ref 1.7–7.7)
OTHER: 0 %
PLATELETS: 349 10*3/uL (ref 150–400)
RBC: 4.71 MIL/uL (ref 3.87–5.11)
RDW: 20.5 % — ABNORMAL HIGH (ref 11.5–15.5)
WBC: 9.8 10*3/uL (ref 4.0–10.5)

## 2017-12-15 LAB — COMPREHENSIVE METABOLIC PANEL
ALK PHOS: 83 U/L (ref 38–126)
ALT: 23 U/L (ref 0–44)
AST: 28 U/L (ref 15–41)
Albumin: 3.5 g/dL (ref 3.5–5.0)
Anion gap: 9 (ref 5–15)
BUN: 10 mg/dL (ref 6–20)
CALCIUM: 8.4 mg/dL — AB (ref 8.9–10.3)
CHLORIDE: 100 mmol/L (ref 98–111)
CO2: 23 mmol/L (ref 22–32)
CREATININE: 0.94 mg/dL (ref 0.44–1.00)
GFR calc Af Amer: >60 mL/min (ref >60)
Glucose, Bld: 91 mg/dL (ref 70–99)
Potassium: 3.3 mmol/L — ABNORMAL LOW (ref 3.5–5.1)
Sodium: 132 mmol/L — ABNORMAL LOW (ref 135–145)
Total Bilirubin: 0.9 mg/dL (ref 0.3–1.2)
Total Protein: 8.4 g/dL — ABNORMAL HIGH (ref 6.5–8.1)

## 2017-12-15 LAB — URINALYSIS, ROUTINE W REFLEX MICROSCOPIC
Bilirubin Urine: NEGATIVE
GLUCOSE, UA: NEGATIVE mg/dL
Ketones, ur: NEGATIVE mg/dL
Nitrite: POSITIVE — AB
PH: 6 (ref 5.0–8.0)
Protein, ur: NEGATIVE mg/dL
SPECIFIC GRAVITY, URINE: 1.015 (ref 1.005–1.030)
WBC, UA: >50 WBC/hpf — ABNORMAL HIGH (ref 0–5)

## 2017-12-15 MED ORDER — GENTAMICIN SULFATE 40 MG/ML IJ SOLN: 7.0000 mg/kg | Freq: Every day | INTRAVENOUS | Status: DC

## 2017-12-15 MED ORDER — PHENAZOPYRIDINE HCL 200 MG PO TABS: 200.0000 mg | ORAL_TABLET | Freq: Three times a day (TID) | ORAL | 0 refills | Status: DC

## 2017-12-15 MED ORDER — CEPHALEXIN 500 MG PO CAPS: 500.0000 mg | ORAL_CAPSULE | Freq: Four times a day (QID) | ORAL | 0 refills | Status: DC

## 2017-12-15 MED ORDER — GENTAMICIN SULFATE 40 MG/ML IJ SOLN
5.0000 mg/kg | Freq: Once | INTRAVENOUS | Status: AC
  Administered 2017-12-15: 550 mg via INTRAVENOUS
  Filled 2017-12-15: qty 13.75

## 2017-12-15 MED ORDER — ONDANSETRON 4 MG PO TBDP: 4.0000 mg | ORAL_TABLET | Freq: Three times a day (TID) | ORAL | 0 refills | Status: DC | PRN

## 2017-12-15 MED ORDER — GENTAMICIN SULFATE 40 MG/ML IJ SOLN: 7.0000 mg/kg | Freq: Once | INTRAVENOUS | Status: DC

## 2017-12-15 MED ORDER — SODIUM CHLORIDE 0.9 % IV SOLN
Freq: Once | INTRAVENOUS | Status: AC
  Administered 2017-12-15: 19:00:00 via INTRAVENOUS

## 2017-12-15 MED ORDER — LEVOFLOXACIN 750 MG PO TABS: 750.0000 mg | ORAL_TABLET | Freq: Every day | ORAL | 0 refills | Status: DC

## 2017-12-15 NOTE — MAU Provider Note (Signed)
History     CSN: [id number]  Arrival date and time: 12/15/17 1528   First Provider Initiated Contact with Patient 12/15/17 1707      Chief Complaint  Patient presents with  . Urinary Tract Infection   HPI  April Gregory is a 37 y.o. year old [id number] non-pregnant female who presents to MAU reporting burning and frequency with urination x 1 month. She delivered 5 months ago at [facility] by cesarean delivery for accreta. She also had a hysterectomy at the time of her cesarean surgery. She also reports that she has had N/V for 3 days, had a temperature of 100.2 last night, has had chills all day, and just not feeling well. Patient states that she is getting married this coming Sunday and she "can't miss work."   Past Medical History:  Diagnosis Date  . Anemia   . Anxiety   . Infection    UTI  . MRSA (methicillin resistant staph aureus) culture positive   . Pregnancy induced hypertension   . Spinal headache   . Vaginal Pap smear, abnormal     Past Surgical History:  Procedure Laterality Date  . ABDOMINAL HYSTERECTOMY    . CESAREAN SECTION    . COLPOSCOPY      Family History  Problem Relation Age of Onset  . Cancer Mother   . Hypertension Mother   . Arthritis Mother   . Learning disabilities Son   . Arthritis Maternal Grandmother   . Cancer Maternal Grandmother     Social History   Tobacco Use  . Smoking status: Never Smoker  . Smokeless tobacco: Never Used  Substance Use Topics  . Alcohol use: No    Alcohol/week: 0.0 standard drinks    Comment: social  . Drug use: No    Allergies:  Allergies  Allergen Reactions  . Flagyl [Metronidazole] Hives  . Other Shortness Of Breath    Pt allergic to Salmon, causes SOB and itching. Pt also allergic to ant bites causes itching, swelling, hives.  . Bactrim [Sulfamethoxazole-Trimethoprim] Itching  . Penicillins Other (See Comments)    Has patient had a PCN reaction causing immediate rash,  facial/tongue/throat swelling, SOB or lightheadedness with hypotension: No Has patient had a PCN reaction causing severe rash involving mucus membranes or skin necrosis: No Has patient had a PCN reaction that required hospitalization No Has patient had a PCN reaction occurring within the last 10 years:yes...5-6 years ago If all of the above answers are "NO", then may proceed with Cephalosporin use.    Medications Prior to Admission  Medication Sig Dispense Refill Last Dose  . hydrOXYzine (VISTARIL) 50 MG capsule Take 1 capsule (50 mg total) by mouth 3 (three) times daily as needed. (Patient not taking: Reported on 07/01/2017) 30 capsule 0 Not Taking  . Prenatal MV-Min-FA-Omega-3 (PRENATAL GUMMIES/DHA & FA PO) Take by mouth.   Taking  . sertraline (ZOLOFT) 100 MG tablet Take 1 tablet (100 mg total) by mouth daily. (Patient not taking: Reported on 06/17/2017) 30 tablet 12 Not Taking    Review of Systems  Constitutional: Positive for activity change ("just don't feel like doing anything"), appetite change, chills, diaphoresis, fatigue and fever.  HENT: Negative.   Eyes: Negative.   Respiratory: Negative.   Cardiovascular: Negative.   Gastrointestinal: Positive for nausea and vomiting.  Endocrine: Negative.   Genitourinary: Positive for flank pain, frequency and urgency.  Musculoskeletal: Positive for back pain.  Skin: Negative.   Allergic/Immunologic: Negative.   Neurological:  Positive for dizziness, light-headedness and headaches.  Hematological: Negative.   Psychiatric/Behavioral: Negative.    Physical Exam   Blood pressure (!) 166/93, pulse 97, temperature 99.6 F (37.6 C), 2nd temp check 99.9 degrees F, resp. rate 16, weight (!) 169.6 kg, NOT breastfeeding.  Physical Exam  Nursing note and vitals reviewed. Constitutional: She is oriented to person, place, and time. She appears well-developed and well-nourished. She appears lethargic. She has a sickly appearance.  HENT:  Head:  Normocephalic and atraumatic.  Eyes: Pupils are equal, round, and reactive to light.  Neck: Normal range of motion.  Cardiovascular: Normal rate, regular rhythm and normal heart sounds.  Respiratory: Effort normal and breath sounds normal.  GI: Soft. There is CVA tenderness.  Genitourinary:  Genitourinary Comments: deferred  Musculoskeletal: Normal range of motion.  Neurological: She is oriented to person, place, and time. She appears lethargic.  Skin: Skin is intact. She is diaphoretic.  Hot with palpation  Psychiatric: She has a normal mood and affect. Her behavior is normal. Judgment and thought content normal.    MAU Course  Procedures  MDM CCUA UCx CBC w/Diff CMP Transfer to [facility] for IV abx tx and admission offered -- patient declines transfer stating "I can't be admitted. I've got to work." Gentamicin 5 mg/kg IV 0.9% NS 1000 ml @ 125 ml/hr until abx infusion completed; then switch to bolus rate  Results for orders placed or performed during the hospital encounter of 12/15/17 (from the past 24 hour(s))  Urine Culture     Status: Abnormal (Preliminary result)   Collection Time: 12/15/17  5:42 PM  Result Value Ref Range   Specimen Description      URINE, CLEAN CATCH Performed at [facility], [address] [place], [place] [address]    Special Requests      Normal Performed at [facility], [address] [place], [place] [address]    Culture >=100,000 COLONIES/mL ESCHERICHIA COLI (A)    Report Status PENDING   CBC with Differential/Platelet     Status: Abnormal   Collection Time: 12/15/17  6:04 PM  Result Value Ref Range   WBC 9.8 4.0 - 10.5 K/uL   RBC 4.71 3.87 - 5.11 MIL/uL   Hemoglobin 8.7 (L) 12.0 - 15.0 g/dL   HCT 09.829.0 (L) 11.936.0 - 14.746.0 %   MCV 61.6 (L) 78.0 - 100.0 fL   MCH 18.5 (L) 26.0 - 34.0 pg   MCHC 30.0 30.0 - 36.0 g/dL   RDW 82.920.5 (H) 56.211.5 - 13.015.5 %   Platelets 349 150 - 400 K/uL   Neutrophils Relative % 68 %   Lymphocytes Relative 28 %    Monocytes Relative 3 %   Eosinophils Relative 1 %   Basophils Relative 0 %   Other 0 %   Neutro Abs 6.7 1.7 - 7.7 K/uL   Lymphs Abs 2.7 0.7 - 4.0 K/uL   Monocytes Absolute 0.3 0.1 - 1.0 K/uL   Eosinophils Absolute 0.1 0.0 - 0.7 K/uL   Basophils Absolute 0.0 0.0 - 0.1 K/uL   RBC Morphology Schistocytes present   Comprehensive metabolic panel     Status: Abnormal   Collection Time: 12/15/17  6:04 PM  Result Value Ref Range   Sodium 132 (L) 135 - 145 mmol/L   Potassium 3.3 (L) 3.5 - 5.1 mmol/L   Chloride 100 98 - 111 mmol/L   CO2 23 22 - 32 mmol/L   Glucose, Bld 91 70 - 99 mg/dL   BUN 10  6 - 20 mg/dL   Creatinine, Ser 1.61 0.44 - 1.00 mg/dL   Calcium 8.4 (L) 8.9 - 10.3 mg/dL   Total Protein 8.4 (H) 6.5 - 8.1 g/dL   Albumin 3.5 3.5 - 5.0 g/dL   AST 28 15 - 41 U/L   ALT 23 0 - 44 U/L   Alkaline Phosphatase 83 38 - 126 U/L   Total Bilirubin 0.9 0.3 - 1.2 mg/dL   GFR calc non Af Amer >60 >60 mL/min   GFR calc Af Amer >60 >60 mL/min   Anion gap 9 5 - 15    *Consult with Dr. Macon Large @ 1730 - notified of patient's complaints, assessments, & lab results, recommended tx plan offer transfer to [facility] for IV abx therapy and admission to treat pyelonephritis, if pt declines transfer advise of severity of her condition and worsening of her condition could lead to sepsis and possible death, also call pharmacy and get advice on best outpatient tx.  *Consult with Delores, Pharmacist - notified of dx of pyelonephritis, patient's allergies and Dr. Mont Dutton requests for pharmacy recommendation for outpatient tx (since patient declines transfer/admission) - recommended tx Gentamicin 5 gm/kg IV then Rx for Levaquin 750 mg qd x 7 days  Assessment and Plan  Pyelonephritis - Plan: Discharge patient - Rx for Levaquin 750 mg po qd x 7 days - Information provided on UTI, pyelonephritis and Levofloxacin - Note to be OOW until Wednesday 12/22/2017 - Advised to rest while completing abx tx at home - Discharge  home - Patient verbalized an understanding of the plan of care and agrees.   Raelyn Mora, MSN, CNM 12/15/2017, 5:12 PM

## 2017-12-15 NOTE — MAU Note (Signed)
Pt presents to MAU with complaints of burning and frequent urination. States she delivered by ces[facility] section [facility] 5 months ago due to accreta and having to have a hysterectomy. Not able to follow up due to vehicle not working

## 2017-12-17 LAB — URINE CULTURE
Culture: >=100000 — AB
Special Requests: NORMAL

## 2018-01-12 ENCOUNTER — Ambulatory Visit (INDEPENDENT_AMBULATORY_CARE_PROVIDER_SITE_OTHER): Payer: 59 | Admitting: Family Medicine

## 2018-01-12 ENCOUNTER — Encounter: Payer: Self-pay | Admitting: Family Medicine

## 2018-01-12 VITALS — BP 136/88 | HR 94 | Temp 98.5°F | Ht 71.0 in | Wt 375.0 lb

## 2018-01-12 DIAGNOSIS — F419 Anxiety disorder, unspecified: Secondary | ICD-10-CM | POA: Diagnosis not present

## 2018-01-12 DIAGNOSIS — N898 Other specified noninflammatory disorders of vagina: Secondary | ICD-10-CM

## 2018-01-12 DIAGNOSIS — E876 Hypokalemia: Secondary | ICD-10-CM

## 2018-01-12 DIAGNOSIS — Z131 Encounter for screening for diabetes mellitus: Secondary | ICD-10-CM | POA: Diagnosis not present

## 2018-01-12 DIAGNOSIS — E66813 Obesity, class 3: Secondary | ICD-10-CM

## 2018-01-12 DIAGNOSIS — R319 Hematuria, unspecified: Secondary | ICD-10-CM

## 2018-01-12 DIAGNOSIS — O99345 Other mental disorders complicating the puerperium: Secondary | ICD-10-CM

## 2018-01-12 DIAGNOSIS — N390 Urinary tract infection, site not specified: Secondary | ICD-10-CM

## 2018-01-12 DIAGNOSIS — L732 Hidradenitis suppurativa: Secondary | ICD-10-CM

## 2018-01-12 DIAGNOSIS — Z6841 Body Mass Index (BMI) 40.0 and over, adult: Secondary | ICD-10-CM

## 2018-01-12 DIAGNOSIS — D649 Anemia, unspecified: Secondary | ICD-10-CM

## 2018-01-12 DIAGNOSIS — F53 Postpartum depression: Secondary | ICD-10-CM

## 2018-01-12 DIAGNOSIS — Z09 Encounter for follow-up examination after completed treatment for conditions other than malignant neoplasm: Secondary | ICD-10-CM

## 2018-01-12 LAB — POCT URINALYSIS DIP (MANUAL ENTRY)
Bilirubin, UA: NEGATIVE
Blood, UA: NEGATIVE
Glucose, UA: NEGATIVE mg/dL
Ketones, POC UA: NEGATIVE mg/dL
Nitrite, UA: NEGATIVE
Protein Ur, POC: NEGATIVE mg/dL
Spec Grav, UA: 1.02 (ref 1.010–1.025)
Urobilinogen, UA: 0.2 E.U./dL
pH, UA: 6.5 (ref 5.0–8.0)

## 2018-01-12 LAB — POCT GLYCOSYLATED HEMOGLOBIN (HGB A1C): Hemoglobin A1C: 5.3 % (ref 4.0–5.6)

## 2018-01-12 MED ORDER — ESCITALOPRAM OXALATE 10 MG PO TABS: 10.0000 mg | ORAL_TABLET | Freq: Every day | ORAL | 1 refills | Status: DC

## 2018-01-12 MED ORDER — BUSPIRONE HCL 10 MG PO TABS: 10.0000 mg | ORAL_TABLET | Freq: Two times a day (BID) | ORAL | 1 refills | Status: DC

## 2018-01-12 MED ORDER — CEPHALEXIN 500 MG PO CAPS: 500.0000 mg | ORAL_CAPSULE | Freq: Two times a day (BID) | ORAL | 0 refills | Status: DC

## 2018-01-12 NOTE — Progress Notes (Signed)
New Patient--Establish Care  Subjective:    Patient ID: April Gregory, female    DOB: Sep 18, 1980, 37 y.o.   MRN: [id number]   Chief Complaint  Patient presents with  . Establish Care  . Anxiety  . Vaginal Itching    HPI  Ms. April Gregory is a 37 year old female with a past medical history of Spinal Headache, MRSA, Anxiety, Post Partum Depression, and Anemia. She is here today for follow up.   Current Status: She has had a recent ED visit on 12/15/2017 and diagnosed with Kidney Infection. She states that she is she is doing well with no complaints.   Patient has a history of Post-Partum Depression and she has been seeing a Therapist, sportssychiatrist. She also has PTSD from a traumatic situation from her previous relationship.   She continues to have boils in her armpits.   She denies fevers, chills, fatigue, recent infections, weight loss, and night sweats. She has not had any headaches, visual changes, dizziness, and falls. No chest pain, heart palpitations, cough and shortness of breath reported. No reports of GI problems such as nausea, vomiting, diarrhea, and constipation. She has no reports of blood in stools, dysuria and hematuria. She denies pain today.   Past Medical History:  Diagnosis Date  . Anemia   . Anxiety   . Infection    UTI  . MRSA (methicillin resistant staph aureus) culture positive   . Pregnancy induced hypertension   . Spinal headache   . Vaginal Pap smear, abnormal     Family History  Problem Relation Age of Onset  . Cancer Mother   . Hypertension Mother   . Arthritis Mother   . Learning disabilities Son   . Arthritis Maternal Grandmother   . Cancer Maternal Grandmother     Social History   Socioeconomic History  . Marital status: Divorced    Spouse name: Not on file  . Number of children: Not on file  . Years of education: Not on file  . Highest education level: Not on file  Occupational History  . Not on file  Social Needs  . Financial resource strain: Not  on file  . Food insecurity:    Worry: Not on file    Inability: Not on file  . Transportation needs:    Medical: Not on file    Non-medical: Not on file  Tobacco Use  . Smoking status: Never Smoker  . Smokeless tobacco: Never Used  Substance and Sexual Activity  . Alcohol use: No    Alcohol/week: 0.0 standard drinks    Comment: social  . Drug use: No  . Sexual activity: Yes    Partners: Male    Birth control/protection: None  Lifestyle  . Physical activity:    Days per week: Not on file    Minutes per session: Not on file  . Stress: Not on file  Relationships  . Social connections:    Talks on phone: Not on file    Gets together: Not on file    Attends religious service: Not on file    Active member of club or organization: Not on file    Attends meetings of clubs or organizations: Not on file    Relationship status: Not on file  . Intimate partner violence:    Fear of current or ex partner: Not on file    Emotionally abused: Not on file    Physically abused: Not on file    Forced sexual activity: Not on  file  Other Topics Concern  . Not on file  Social History Narrative  . Not on file    Past Surgical History:  Procedure Laterality Date  . ABDOMINAL HYSTERECTOMY    . CESAREAN SECTION    . COLPOSCOPY      Immunization History  Administered Date(s) Administered  . Influenza-Unspecified 12/27/2016  . Tdap 12/04/2011   No outpatient medications have been marked as taking for the 01/12/18 encounter (Office Visit) with Kallie Locks, FNP.    Allergies  Allergen Reactions  . Flagyl [Metronidazole] Hives  . Other Shortness Of Breath    Pt allergic to Salmon, causes SOB and itching. Pt also allergic to ant bites causes itching, swelling, hives.  . Bactrim [Sulfamethoxazole-Trimethoprim] Itching  . Penicillins Other (See Comments)    Has patient had a PCN reaction causing immediate rash, facial/tongue/throat swelling, SOB or lightheadedness with hypotension:  No Has patient had a PCN reaction causing severe rash involving mucus membranes or skin necrosis: No Has patient had a PCN reaction that required hospitalization No Has patient had a PCN reaction occurring within the last 10 years:yes...5-6 years ago If all of the above answers are "NO", then may proceed with Cephalosporin use.    BP 136/88 (BP Location: Right Wrist, Patient Position: Sitting, Cuff Size: Large)   Pulse 94   Temp 98.5 F (36.9 C) (Oral)   Ht 5\' 11"  (1.803 m)   Wt (!) 375 lb (170.1 kg)   LMP 11/01/2016 (Approximate)   SpO2 100%   BMI 52.30 kg/m   Review of Systems  Constitutional: Negative.   HENT: Negative.   Respiratory: Negative.   Cardiovascular: Negative.   Gastrointestinal: Negative.   Genitourinary: Negative.   Musculoskeletal: Negative.   Skin: Negative.   Allergic/Immunologic: Negative.   Neurological: Negative.   Hematological: Negative.   Psychiatric/Behavioral: Negative.     Objective:   Physical Exam  Constitutional: She is oriented to person, place, and time. She appears well-developed and well-nourished.  Neck: Normal range of motion. Neck supple.  Cardiovascular: Normal rate, regular rhythm, normal heart sounds and intact distal pulses.  Pulmonary/Chest: Effort normal and breath sounds normal.  Abdominal: Soft. Bowel sounds are normal. She exhibits distension (Obese).  Musculoskeletal: Normal range of motion.  Neurological: She is alert and oriented to person, place, and time.  Skin: Skin is warm and dry.   Hidradenitis suppurativa bilateral axillas  Psychiatric: She has a normal mood and affect. Her behavior is normal. Judgment and thought content normal.  Nursing note and vitals reviewed.  Assessment & Plan:   1. Anxiety We will initiate Buspar and Lexapro for anxiety today.  - busPIRone (BUSPAR) 10 MG tablet; Take 1 tablet (10 mg total) by mouth 2 (two) times daily.  Dispense: 60 tablet; Refill: 1  2. Postpartum depression -  escitalopram (LEXAPRO) 10 MG tablet; Take 1 tablet (10 mg total) by mouth daily.  Dispense: 30 tablet; Refill: 1  3. Vaginal itching Swab results are pending.  - Vaginitis/Vaginosis, DNA Probe  4. Class 3 severe obesity due to excess calories without serious comorbidity with body mass index (BMI) of 50.0 to 59.9 in adult [facility]) Her goal BMI is <30. Encouraged efforts to reduce weight include engaging in physical activity as tolerated with goal of 150 minutes per week. Improve dietary choices and eat a meal regimen consistent with a Mediterranean or DASH diet. Reduce simple carbohydrates. Do not skip meals and eat healthy snacks throughout the day to avoid over-eating at dinner.  Set a goal weight loss that is achievable for you.  5. Hidradenitis suppurativa Multiple boils in armpits. She will benefit while taking Keflex for UTI. Monitor.   6. Screening for diabetes mellitus Hgb A1c is at normal range of 5.3 today. She will continue to decrease foods/beverages high in sugars and carbs and follow Heart Healthy or DASH diet. Increase physical activity to at least 30 minutes cardio exercise daily.  - POCT urinalysis dipstick - POCT glycosylated hemoglobin (Hb A1C)  7. Urinary tract infection with hematuria, site unspecified - cephALEXin (KEFLEX) 500 MG capsule; Take 1 capsule (500 mg total) by mouth 2 (two) times daily.  Dispense: 14 capsule; Refill: 0  8. Anemia, unspecified type Hgb at 8.7 on 12/15/2017. We will initiate oral iron today. Monitor for increase fatigue and shortness of breath.   9. Hypokalemia K+ is on 12/15/2017. We will initiate K-dur 20 meq daily. We will continue to monitor.   10. Follow up She will follow up in 6 weeks.   No outpatient medications have been marked as taking for the 01/12/18 encounter (Office Visit) with Kallie Locks, FNP.    Raliegh Ip,  MSN, FNP-C [facility] [address] [place], [place]  [address] [phone]

## 2018-01-12 NOTE — Patient Instructions (Addendum)
Escitalopram tablets What is this medicine? ESCITALOPRAM (es sye TAL oh pram) is used to treat depression and certain types of anxiety. This medicine may be used for other purposes; ask your health care provider or pharmacist if you have questions. COMMON BRAND NAME(S): Lexapro What should I tell my health care provider before I take this medicine? They need to know if you have any of these conditions: -bipolar disorder or a family history of bipolar disorder -diabetes -glaucoma -heart disease -kidney or liver disease -receiving electroconvulsive therapy -seizures (convulsions) -suicidal thoughts, plans, or attempt by you or a family member -an unusual or allergic reaction to escitalopram, the related drug citalopram, other medicines, foods, dyes, or preservatives -pregnant or trying to become pregnant -breast-feeding How should I use this medicine? Take this medicine by mouth with a glass of water. Follow the directions on the prescription label. You can take it with or without food. If it upsets your stomach, take it with food. Take your medicine at regular intervals. Do not take it more often than directed. Do not stop taking this medicine suddenly except upon the advice of your doctor. Stopping this medicine too quickly may cause serious side effects or your condition may worsen. A special MedGuide will be given to you by the pharmacist with each prescription and refill. Be sure to read this information carefully each time. Talk to your pediatrician regarding the use of this medicine in children. Special care may be needed. Overdosage: If you think you have taken too much of this medicine contact a poison control center or emergency room at once. NOTE: This medicine is only for you. Do not share this medicine with others. What if I miss a dose? If you miss a dose, take it as soon as you can. If it is almost time for your next dose, take only that dose. Do not take double or extra  doses. What may interact with this medicine? Do not take this medicine with any of the following medications: -certain medicines for fungal infections like fluconazole, itraconazole, ketoconazole, posaconazole, voriconazole -cisapride -citalopram -dofetilide -dronedarone -linezolid -MAOIs like Carbex, Eldepryl, Marplan, Nardil, and Parnate -methylene blue (injected into a vein) -pimozide -thioridazine -ziprasidone This medicine may also interact with the following medications: -alcohol -amphetamines -aspirin and aspirin-like medicines -carbamazepine -certain medicines for depression, anxiety, or psychotic disturbances -certain medicines for migraine headache like almotriptan, eletriptan, frovatriptan, naratriptan, rizatriptan, sumatriptan, zolmitriptan -certain medicines for sleep -certain medicines that treat or prevent blood clots like warfarin, enoxaparin, dalteparin -cimetidine -diuretics -fentanyl -furazolidone -isoniazid -lithium -metoprolol -NSAIDs, medicines for pain and inflammation, like ibuprofen or naproxen -other medicines that prolong the QT interval (cause an abnormal heart rhythm) -procarbazine -rasagiline -supplements like St. John's wort, kava kava, valerian -tramadol -tryptophan This list may not describe all possible interactions. Give your health care provider a list of all the medicines, herbs, non-prescription drugs, or dietary supplements you use. Also tell them if you smoke, drink alcohol, or use illegal drugs. Some items may interact with your medicine. What should I watch for while using this medicine? Tell your doctor if your symptoms do not get better or if they get worse. Visit your doctor or health care professional for regular checks on your progress. Because it may take several weeks to see the full effects of this medicine, it is important to continue your treatment as prescribed by your doctor. Patients and their families should watch out for  new or worsening thoughts of suicide or depression. Also watch out for   sudden changes in feelings such as feeling anxious, agitated, panicky, irritable, hostile, aggressive, impulsive, severely restless, overly excited and hyperactive, or not being able to sleep. If this happens, especially at the beginning of treatment or after a change in dose, call your health care professional. Bonita QuinYou may get drowsy or dizzy. Do not drive, use machinery, or do anything that needs mental alertness until you know how this medicine affects you. Do not stand or sit up quickly, especially if you are an older patient. This reduces the risk of dizzy or fainting spells. Alcohol may interfere with the effect of this medicine. Avoid alcoholic drinks. Your mouth may get dry. Chewing sugarless gum or sucking hard candy, and drinking plenty of water may help. Contact your doctor if the problem does not go away or is severe. What side effects may I notice from receiving this medicine? Side effects that you should report to your doctor or health care professional as soon as possible: -allergic reactions like skin rash, itching or hives, swelling of the face, lips, or tongue -anxious -black, tarry stools -changes in vision -confusion -elevated mood, decreased need for sleep, racing thoughts, impulsive behavior -eye pain -fast, irregular heartbeat -feeling faint or lightheaded, falls -feeling agitated, angry, or irritable -hallucination, loss of contact with reality -loss of balance or coordination -loss of memory -painful or prolonged erections -restlessness, pacing, inability to keep still -seizures -stiff muscles -suicidal thoughts or other mood changes -trouble sleeping -unusual bleeding or bruising -unusually weak or tired -vomiting Side effects that usually do not require medical attention (report to your doctor or health care professional if they continue or are bothersome): -changes in appetite -change in sex  drive or performance -headache -increased sweating -indigestion, nausea -tremors This list may not describe all possible side effects. Call your doctor for medical advice about side effects. You may report side effects to FDA at 1-800-FDA-1088. Where should I keep my medicine? Keep out of reach of children. Store at room temperature between 15 and 30 degrees C (59 and 86 degrees F). Throw away any unused medicine after the expiration date. NOTE: This sheet is a summary. It may not cover all possible information. If you have questions about this medicine, talk to your doctor, pharmacist, or health care provider.  2018 Elsevier/Gold Standard (2015-09-10 13:20:23)     Buspirone tablets What is this medicine? BUSPIRONE (byoo SPYE rone) is used to treat anxiety disorders. This medicine may be used for other purposes; ask your health care provider or pharmacist if you have questions. COMMON BRAND NAME(S): BuSpar What should I tell my health care provider before I take this medicine? They need to know if you have any of these conditions: -kidney or liver disease -an unusual or allergic reaction to buspirone, other medicines, foods, dyes, or preservatives -pregnant or trying to get pregnant -breast-feeding How should I use this medicine? Take this medicine by mouth with a glass of water. Follow the directions on the prescription label. You may take this medicine with or without food. To ensure that this medicine always works the same way for you, you should take it either always with or always without food. Take your doses at regular intervals. Do not take your medicine more often than directed. Do not stop taking except on the advice of your doctor or health care professional. Talk to your pediatrician regarding the use of this medicine in children. Special care may be needed. Overdosage: If you think you have taken too much of this medicine  contact a poison control center or emergency room at  once. NOTE: This medicine is only for you. Do not share this medicine with others. What if I miss a dose? If you miss a dose, take it as soon as you can. If it is almost time for your next dose, take only that dose. Do not take double or extra doses. What may interact with this medicine? Do not take this medicine with any of the following medications: -linezolid -MAOIs like Carbex, Eldepryl, Marplan, Nardil, and Parnate -methylene blue -procarbazine This medicine may also interact with the following medications: -diazepam -digoxin -diltiazem -erythromycin -grapefruit juice -haloperidol -medicines for mental depression or mood problems -medicines for seizures like carbamazepine, phenobarbital and phenytoin -nefazodone -other medications for anxiety -rifampin -ritonavir -some antifungal medicines like itraconazole, ketoconazole, and voriconazole -verapamil -warfarin This list may not describe all possible interactions. Give your health care provider a list of all the medicines, herbs, non-prescription drugs, or dietary supplements you use. Also tell them if you smoke, drink alcohol, or use illegal drugs. Some items may interact with your medicine. What should I watch for while using this medicine? Visit your doctor or health care professional for regular checks on your progress. It may take 1 to 2 weeks before your anxiety gets better. You may get drowsy or dizzy. Do not drive, use machinery, or do anything that needs mental alertness until you know how this drug affects you. Do not stand or sit up quickly, especially if you are an older patient. This reduces the risk of dizzy or fainting spells. Alcohol can make you more drowsy and dizzy. Avoid alcoholic drinks. What side effects may I notice from receiving this medicine? Side effects that you should report to your doctor or health care professional as soon as possible: -blurred vision or other vision changes -chest  pain -confusion -difficulty breathing -feelings of hostility or anger -muscle aches and pains -numbness or tingling in hands or feet -ringing in the ears -skin rash and itching -vomiting -weakness Side effects that usually do not require medical attention (report to your doctor or health care professional if they continue or are bothersome): -disturbed dreams, nightmares -headache -nausea -restlessness or nervousness -sore throat and nasal congestion -stomach upset This list may not describe all possible side effects. Call your doctor for medical advice about side effects. You may report side effects to FDA at 1-800-FDA-1088. Where should I keep my medicine? Keep out of the reach of children. Store at room temperature below 30 degrees C (86 degrees F). Protect from light. Keep container tightly closed. Throw away any unused medicine after the expiration date. NOTE: This sheet is a summary. It may not cover all possible information. If you have questions about this medicine, talk to your doctor, pharmacist, or health care provider.  2018 Elsevier/Gold Standard (2009-11-15 18:06:11)     DASH Eating Plan DASH stands for "Dietary Approaches to Stop Hypertension." The DASH eating plan is a healthy eating plan that has been shown to reduce high blood pressure (hypertension). It may also reduce your risk for type 2 diabetes, heart disease, and stroke. The DASH eating plan may also help with weight loss. What are tips for following this plan? General guidelines  Avoid eating more than 2,300 mg (milligrams) of salt (sodium) a day. If you have hypertension, you may need to reduce your sodium intake to 1,500 mg a day.  Limit alcohol intake to no more than 1 drink a day for nonpregnant women and 2 drinks  a day for men. One drink equals 12 oz of beer, 5 oz of wine, or 1 oz of hard liquor.  Work with your health care provider to maintain a healthy body weight or to lose weight. Ask what an  ideal weight is for you.  Get at least 30 minutes of exercise that causes your heart to beat faster (aerobic exercise) most days of the week. Activities may include walking, swimming, or biking.  Work with your health care provider or diet and nutrition specialist (dietitian) to adjust your eating plan to your individual calorie needs. Reading food labels  Check food labels for the amount of sodium per serving. Choose foods with less than 5 percent of the Daily Value of sodium. Generally, foods with less than 300 mg of sodium per serving fit into this eating plan.  To find whole grains, look for the word "whole" as the first word in the ingredient list. Shopping  Buy products labeled as "low-sodium" or "no salt added."  Buy fresh foods. Avoid canned foods and premade or frozen meals. Cooking  Avoid adding salt when cooking. Use salt-free seasonings or herbs instead of table salt or sea salt. Check with your health care provider or pharmacist before using salt substitutes.  Do not fry foods. Cook foods using healthy methods such as baking, boiling, grilling, and broiling instead.  Cook with heart-healthy oils, such as olive, canola, soybean, or sunflower oil. Meal planning   Eat a balanced diet that includes: ? 5 or more servings of fruits and vegetables each day. At each meal, try to fill half of your plate with fruits and vegetables. ? Up to 6-8 servings of whole grains each day. ? Less than 6 oz of lean meat, poultry, or fish each day. A 3-oz serving of meat is about the same size as a deck of cards. One egg equals 1 oz. ? 2 servings of low-fat dairy each day. ? A serving of nuts, seeds, or beans 5 times each week. ? Heart-healthy fats. Healthy fats called Omega-3 fatty acids are found in foods such as flaxseeds and coldwater fish, like sardines, salmon, and mackerel.  Limit how much you eat of the following: ? Canned or prepackaged foods. ? Food that is high in trans fat, such  as fried foods. ? Food that is high in saturated fat, such as fatty meat. ? Sweets, desserts, sugary drinks, and other foods with added sugar. ? Full-fat dairy products.  Do not salt foods before eating.  Try to eat at least 2 vegetarian meals each week.  Eat more home-cooked food and less restaurant, buffet, and fast food.  When eating at a restaurant, ask that your food be prepared with less salt or no salt, if possible. What foods are recommended? The items listed may not be a complete list. Talk with your dietitian about what dietary choices are best for you. Grains Whole-grain or whole-wheat bread. Whole-grain or whole-wheat pasta. Brown rice. Orpah Cobb. Bulgur. Whole-grain and low-sodium cereals. Pita bread. Low-fat, low-sodium crackers. Whole-wheat flour tortillas. Vegetables Fresh or frozen vegetables (raw, steamed, roasted, or grilled). Low-sodium or reduced-sodium tomato and vegetable juice. Low-sodium or reduced-sodium tomato sauce and tomato paste. Low-sodium or reduced-sodium canned vegetables. Fruits All fresh, dried, or frozen fruit. Canned fruit in natural juice (without added sugar). Meat and other protein foods Skinless chicken or [place]. Ground chicken or [place]. Pork with fat trimmed off. Fish and seafood. Egg whites. Dried beans, peas, or lentils. Unsalted nuts, nut butters, and  seeds. Unsalted canned beans. Lean cuts of beef with fat trimmed off. Low-sodium, lean deli meat. Dairy Low-fat (1%) or fat-free (skim) milk. Fat-free, low-fat, or reduced-fat cheeses. Nonfat, low-sodium ricotta or cottage cheese. Low-fat or nonfat yogurt. Low-fat, low-sodium cheese. Fats and oils Soft margarine without trans fats. Vegetable oil. Low-fat, reduced-fat, or light mayonnaise and salad dressings (reduced-sodium). Canola, safflower, olive, soybean, and sunflower oils. Avocado. Seasoning and other foods Herbs. Spices. Seasoning mixes without salt. Unsalted popcorn and pretzels.  Fat-free sweets. What foods are not recommended? The items listed may not be a complete list. Talk with your dietitian about what dietary choices are best for you. Grains Baked goods made with fat, such as croissants, muffins, or some breads. Dry pasta or rice meal packs. Vegetables Creamed or fried vegetables. Vegetables in a cheese sauce. Regular canned vegetables (not low-sodium or reduced-sodium). Regular canned tomato sauce and paste (not low-sodium or reduced-sodium). Regular tomato and vegetable juice (not low-sodium or reduced-sodium). Rosita Fire. Olives. Fruits Canned fruit in a light or heavy syrup. Fried fruit. Fruit in cream or butter sauce. Meat and other protein foods Fatty cuts of meat. Ribs. Fried meat. Tomasa Blase. Sausage. Bologna and other processed lunch meats. Salami. Fatback. Hotdogs. Bratwurst. Salted nuts and seeds. Canned beans with added salt. Canned or smoked fish. Whole eggs or egg yolks. Chicken or [place] with skin. Dairy Whole or 2% milk, cream, and half-and-half. Whole or full-fat cream cheese. Whole-fat or sweetened yogurt. Full-fat cheese. Nondairy creamers. Whipped toppings. Processed cheese and cheese spreads. Fats and oils Butter. Stick margarine. Lard. Shortening. Ghee. Bacon fat. Tropical oils, such as coconut, palm kernel, or palm oil. Seasoning and other foods Salted popcorn and pretzels. Onion salt, garlic salt, seasoned salt, table salt, and sea salt. Worcestershire sauce. Tartar sauce. Barbecue sauce. Teriyaki sauce. Soy sauce, including reduced-sodium. Steak sauce. Canned and packaged gravies. Fish sauce. Oyster sauce. Cocktail sauce. Horseradish that you find on the shelf. Ketchup. Mustard. Meat flavorings and tenderizers. Bouillon cubes. Hot sauce and Tabasco sauce. Premade or packaged marinades. Premade or packaged taco seasonings. Relishes. Regular salad dressings. Where to find more information:  National Heart, Lung, and Blood Institute:  PopSteam.is  American Heart Association: www.heart.org Summary  The DASH eating plan is a healthy eating plan that has been shown to reduce high blood pressure (hypertension). It may also reduce your risk for type 2 diabetes, heart disease, and stroke.  With the DASH eating plan, you should limit salt (sodium) intake to 2,300 mg a day. If you have hypertension, you may need to reduce your sodium intake to 1,500 mg a day.  When on the DASH eating plan, aim to eat more fresh fruits and vegetables, whole grains, lean proteins, low-fat dairy, and heart-healthy fats.  Work with your health care provider or diet and nutrition specialist (dietitian) to adjust your eating plan to your individual calorie needs. This information is not intended to replace advice given to you by your health care provider. Make sure you discuss any questions you have with your health care provider. Document Released: 03/27/2011 Document Revised: 03/31/2016 Document Reviewed: 03/31/2016 Elsevier Interactive Patient Education  2018 [employer].       Heart-Healthy Eating Plan Heart-healthy meal planning includes:  Limiting unhealthy fats.  Increasing healthy fats.  Making other small dietary changes.  You may need to talk with your doctor or a diet specialist (dietitian) to create an eating plan that is right for you. What types of fat should I choose?  Choose healthy fats.  These include olive oil and canola oil, flaxseeds, walnuts, almonds, and seeds.  Eat more omega-3 fats. These include salmon, mackerel, sardines, tuna, flaxseed oil, and ground flaxseeds. Try to eat fish at least twice each week.  Limit saturated fats. ? Saturated fats are often found in animal products, such as meats, butter, and cream. ? Plant sources of saturated fats include palm oil, palm kernel oil, and coconut oil.  Avoid foods with partially hydrogenated oils in them. These include stick margarine, some tub margarines,  cookies, crackers, and other baked goods. These contain trans fats. What general guidelines do I need to follow?  Check food labels carefully. Identify foods with trans fats or high amounts of saturated fat.  Fill one half of your plate with vegetables and green salads. Eat 4-5 servings of vegetables per day. A serving of vegetables is: ? 1 cup of raw leafy vegetables. ?  cup of raw or cooked cut-up vegetables. ?  cup of vegetable juice.  Fill one fourth of your plate with whole grains. Look for the word "whole" as the first word in the ingredient list.  Fill one fourth of your plate with lean protein foods.  Eat 4-5 servings of fruit per day. A serving of fruit is: ? One medium whole fruit. ?  cup of dried fruit. ?  cup of fresh, frozen, or canned fruit. ?  cup of 100% fruit juice.  Eat more foods that contain soluble fiber. These include apples, broccoli, carrots, beans, peas, and barley. Try to get 20-30 g of fiber per day.  Eat more home-cooked food. Eat less restaurant, buffet, and fast food.  Limit or avoid alcohol.  Limit foods high in starch and sugar.  Avoid fried foods.  Avoid frying your food. Try baking, boiling, grilling, or broiling it instead. You can also reduce fat by: ? Removing the skin from poultry. ? Removing all visible fats from meats. ? Skimming the fat off of stews, soups, and gravies before serving them. ? Steaming vegetables in water or broth.  Lose weight if you are overweight.  Eat 4-5 servings of nuts, legumes, and seeds per week: ? One serving of dried beans or legumes equals  cup after being cooked. ? One serving of nuts equals 1 ounces. ? One serving of seeds equals  ounce or one tablespoon.  You may need to keep track of how much salt or sodium you eat. This is especially true if you have high blood pressure. Talk with your doctor or dietitian to get more information. What foods can I eat? Grains Breads, including [place], white,  pita, wheat, raisin, rye, oatmeal, and [place]. Tortillas that are neither fried nor made with lard or trans fat. Low-fat rolls, including hotdog and hamburger buns and English muffins. Biscuits. Muffins. Waffles. Pancakes. Light popcorn. Whole-grain cereals. Flatbread. Melba toast. Pretzels. Breadsticks. Rusks. Low-fat snacks. Low-fat crackers, including oyster, saltine, matzo, graham, animal, and rye. Rice and pasta, including brown rice and pastas that are made with whole wheat. Vegetables All vegetables. Fruits All fruits, but limit coconut. Meats and Other Protein Sources Lean, well-trimmed beef, veal, pork, and lamb. Chicken and [place] without skin. All fish and shellfish. Wild duck, rabbit, pheasant, and venison. Egg whites or low-cholesterol egg substitutes. Dried beans, peas, lentils, and tofu. Seeds and most nuts. Dairy Low-fat or nonfat cheeses, including ricotta, string, and mozzarella. Skim or 1% milk that is liquid, powdered, or evaporated. Buttermilk that is made with low-fat milk. Nonfat or low-fat yogurt. Beverages Mineral  water. Diet carbonated beverages. Sweets and Desserts Sherbets and fruit ices. Honey, jam, marmalade, jelly, and syrups. Meringues and gelatins. Pure sugar candy, such as hard candy, jelly beans, gumdrops, mints, marshmallows, and small amounts of dark chocolate. [employer]. Eat all sweets and desserts in moderation. Fats and Oils Nonhydrogenated (trans-free) margarines. Vegetable oils, including soybean, sesame, sunflower, olive, peanut, safflower, corn, canola, and cottonseed. Salad dressings or mayonnaise made with a vegetable oil. Limit added fats and oils that you use for cooking, baking, salads, and as spreads. Other Cocoa powder. Coffee and tea. All seasonings and condiments. The items listed above may not be a complete list of recommended foods or beverages. Contact your dietitian for more options. What foods are not recommended? Grains Breads  that are made with saturated or trans fats, oils, or whole milk. Croissants. Butter rolls. Cheese breads. Sweet rolls. Donuts. Buttered popcorn. Chow mein noodles. High-fat crackers, such as cheese or butter crackers. Meats and Other Protein Sources Fatty meats, such as hotdogs, short ribs, sausage, spareribs, bacon, rib eye roast or steak, and mutton. High-fat deli meats, such as salami and bologna. Caviar. Domestic duck and goose. Organ meats, such as kidney, liver, sweetbreads, and heart. Dairy Cream, sour cream, cream cheese, and creamed cottage cheese. Whole-milk cheeses, including blue (bleu), [address], [place], [place], [address], [place], [address], cheddar, [place], and [place]. Whole or 2% milk that is liquid, evaporated, or condensed. Whole buttermilk. Cream sauce or high-fat cheese sauce. Yogurt that is made from whole milk. Beverages Regular sodas and juice drinks with added sugar. Sweets and Desserts Frosting. Pudding. Cookies. Cakes other than angel food cake. Candy that has milk chocolate or white chocolate, hydrogenated fat, butter, coconut, or unknown ingredients. Buttered syrups. Full-fat ice cream or ice cream drinks. Fats and Oils Gravy that has suet, meat fat, or shortening. Cocoa butter, hydrogenated oils, palm oil, coconut oil, palm kernel oil. These can often be found in baked products, candy, fried foods, nondairy creamers, and whipped toppings. Solid fats and shortenings, including bacon fat, salt pork, lard, and butter. Nondairy cream substitutes, such as coffee creamers and sour cream substitutes. Salad dressings that are made of unknown oils, cheese, or sour cream. The items listed above may not be a complete list of foods and beverages to avoid. Contact your dietitian for more information. This information is not intended to replace advice given to you by your health care provider. Make sure you discuss any questions you have with your health care provider. Document  Released: 10/07/2011 Document Revised: 09/13/2015 Document Reviewed: 09/29/2013 Elsevier Interactive Patient Education  2018 [employer].  Iron tablets, capsules, extended-release tablets What is this medicine? IRON (AHY ern) replaces iron that is essential to healthy red blood cells. Iron is used to treat iron deficiency anemia. Anemia may cause problems like tiredness, shortness of breath, or slowed growth in children. Only take iron if your doctor has told you to. Do not treat yourself with iron if you are feeling tired. Most healthy people get enough iron in their diets, particularly if they eat cereals, meat, poultry, and fish. This medicine may be used for other purposes; ask your health care provider or pharmacist if you have questions. COMMON BRAND NAME(S): Bifera, Duofer, Feosol, Feosol Complete, [employer], [place], [place], [place], [place], Ferrimin, Ferro-Sequels, Ferrocite, Hemocyte, Nephro-Fer, NovaFerrum, ProFe, Proferrin ES, Slow Fe, Slow Iron, Tandem What should I tell my health care provider before I take this medicine? They need to know if you have any of these  conditions: -frequently drink alcohol -bowel disease -hemolytic anemia -iron overload (hemochromatosis, hemosiderosis) -liver disease -problems with swallowing -stomach ulcer or other stomach problems -an unusual or allergic reaction to iron, other medicines, foods, dyes, or preservatives -pregnant or trying to get pregnant -breast-feeding How should I use this medicine? Take this medicine by mouth with a glass of water or fruit juice. Follow the directions on the prescription label. Swallow whole. Do not crush or chew. Take this medicine in an upright or sitting position. Try to take any bedtime doses at least 10 minutes before lying down. You may take this medicine with food. Take your medicine at regular intervals. Do not take your medicine more often than directed. Do not stop taking except on your  doctor's advice. Talk to your pediatrician regarding the use of this medicine in children. While this drug may be prescribed for selected conditions, precautions do apply. Overdosage: If you think you have taken too much of this medicine contact a poison control center or emergency room at once. NOTE: This medicine is only for you. Do not share this medicine with others. What if I miss a dose? If you miss a dose, take it as soon as you can. If it is almost time for your next dose, take only that dose. Do not take double or extra doses. What may interact with this medicine? If you are taking this iron product, you should not take iron in any other medicine or dietary supplement. This medicine may also interact with the following medications: -alendronate -antacids -cefdinir -chloramphenicol -cholestyramine -deferoxamine -dimercaprol -etidronate -medicines for stomach ulcers or other stomach problems -pancreatic enzymes -quinolone antibiotics (examples: Cipro, Floxin, Levaquin, Tequin and others) -risedronate -tetracycline antibiotics (examples: doxycycline, tetracycline, minocycline, and others) -thyroid hormones This list may not describe all possible interactions. Give your health care provider a list of all the medicines, herbs, non-prescription drugs, or dietary supplements you use. Also tell them if you smoke, drink alcohol, or use illegal drugs. Some items may interact with your medicine. What should I watch for while using this medicine? Use iron supplements only as directed by your health care professional. Bonita Quin will need important blood work while you are taking this medicine. It may take 3 to 6 months of therapy to treat low iron levels. Pregnant women should follow the dose and length of iron treatment as directed by their doctors. Do not use iron longer than prescribed, and do not take a higher dose than recommended. Long-term use may cause excess iron to build-up in the body. Do  not take iron with antacids. If you need to take an antacid, take it 2 hours after a dose of iron. What side effects may I notice from receiving this medicine? Side effects that you should report to your doctor or health care professional as soon as possible: -allergic reactions like skin rash, itching or hives, swelling of the face, lips, or tongue -blue lips, nails, or palms -dark colored stools (this may be due to the iron, but can indicate a more serious condition) -drowsiness -pain with or difficulty swallowing -pale or clammy skin -seizures -stomach pain -unusually weak or tired -vomiting -weak, fast, or irregular heartbeat Side effects that usually do not require medical attention (report to your doctor or health care professional if they continue or are bothersome): -constipation -indigestion -nausea or stomach upset This list may not describe all possible side effects. Call your doctor for medical advice about side effects. You may report side effects to FDA at  1-800-FDA-1088. Where should I keep my medicine? Keep out of the reach of children. Even small amounts of iron can be harmful to a child. Store at room temperature between 15 and 30 degrees C (59 and 86 degrees F). Keep container tightly closed. Throw away any unused medicine after the expiration date. NOTE: This sheet is a summary. It may not cover all possible information. If you have questions about this medicine, talk to your doctor, pharmacist, or health care provider.  2018 Elsevier/Gold Standard (2007-08-24 17:03:41) Potassium Salts tablets, extended-release tablets or capsules What is this medicine? POTASSIUM (poe TASS i um) is a natural salt that is important for the heart, muscles, and nerves. It is found in many foods and is normally supplied by a well balanced diet. This medicine is used to treat low potassium. This medicine may be used for other purposes; ask your health care provider or pharmacist if you have  questions. COMMON BRAND NAME(S): ED-K+10, Glu-K, K-10, K-8, K-Dur, K-Tab, Kaon-CL, Klor-Con, Klor-Con M10, Klor-Con M15, Klor-Con M20, Klotrix, Micro-K, Micro-K Extencaps, Slow-K What should I tell my health care provider before I take this medicine? They need to know if you have any of these conditions: -Addison's disease -dehydration -diabetes -difficulty swallowing -heart disease -history of high levels of potassium in the blood -irregular heartbeat -kidney disease -recent severe burn -stomach ulcers or other stomach problems -an unusual or allergic reaction to potassium, tartrazine, other medicines, foods, dyes, or preservatives -pregnant or trying to get pregnant -breast-feeding How should I use this medicine? Take this medicine by mouth with a full glass of water. Take with food. Follow the directions on the prescription label. Do not suck on, crush, or chew this medicine. If you have difficulty swallowing, ask the pharmacist how to take. Take your medicine at regular intervals. Do not take it more often than directed. Do not stop taking except on your doctor's advice. Talk to your pediatrician regarding the use of this medicine in children. Special care may be needed. Overdosage: If you think you have taken too much of this medicine contact a poison control center or emergency room at once. NOTE: This medicine is only for you. Do not share this medicine with others. What if I miss a dose? If you miss a dose, take it as soon as you can. If it is almost time for your next dose, take only that dose. Do not take double or extra doses. What may interact with this medicine? Do not take this medicine with any of the following medications: -eplerenone -certain medicines for stomach problems like atropine; difenoxin and glycopyrrolate -sodium polystyrene sulfonate This medicine may also interact with the following medications: -certain medicines for blood pressure or heart disease like  lisinopril, losartan, quinapril, valsartan -medicines for cold or allergies -NSAIDs, medicines for pain and inflammation, like ibuprofen or napoxen -other potassium supplements -salt substitutes -some diuretics This list may not describe all possible interactions. Give your health care provider a list of all the medicines, herbs, non-prescription drugs, or dietary supplements you use. Also tell them if you smoke, drink alcohol, or use illegal drugs. Some items may interact with your medicine. What should I watch for while using this medicine? Visit your doctor or health care professional for regular check ups. You will need lab work done regularly. You may need to be on a special diet while taking this medicine. Ask your doctor. What side effects may I notice from receiving this medicine? Side effects that you should report to  your doctor or health care professional as soon as possible: -allergic reactions like skin rash, itching or hives, swelling of the face, lips, or tongue -anxious -black, tarry stools -breathing problems -confusion -heartburn -irregular heartbeat -numbness or tingling in hands or feet -pain when swallowing -unusually weak or tired -weakness, heaviness of legs Side effects that usually do not require medical attention (report to your doctor or health care professional if they continue or are bothersome): -diarrhea -nausea -upset stomach -vomiting This list may not describe all possible side effects. Call your doctor for medical advice about side effects. You may report side effects to FDA at 1-800-FDA-1088. Where should I keep my medicine? Keep out of the reach of children. Store at room temperature between 15 and 30 degrees C (59 and 86 degrees F ). Keep bottle closed tightly to protect this medicine from light and moisture. Throw away any unused medicine after the expiration date. NOTE: This sheet is a summary. It may not cover all possible information. If you  have questions about this medicine, talk to your doctor, pharmacist, or health care provider.  2018 Elsevier/Gold Standard (2014-09-14 08:55:21)

## 2018-01-13 MED ORDER — FERROUS SULFATE 325 (65 FE) MG PO TABS: 325.0000 mg | ORAL_TABLET | Freq: Every day | ORAL | 3 refills | Status: DC

## 2018-01-13 MED ORDER — POTASSIUM CHLORIDE CRYS ER 20 MEQ PO TBCR: 20.0000 meq | EXTENDED_RELEASE_TABLET | Freq: Once | ORAL | 3 refills | Status: DC

## 2018-01-14 LAB — VAGINITIS/VAGINOSIS, DNA PROBE
Candida Species: POSITIVE — AB
Gardnerella vaginalis: NEGATIVE
Trichomonas vaginosis: NEGATIVE

## 2018-01-22 ENCOUNTER — Other Ambulatory Visit: Payer: Self-pay | Admitting: Family Medicine

## 2018-01-22 ENCOUNTER — Telehealth: Payer: Self-pay

## 2018-01-22 DIAGNOSIS — B373 Candidiasis of vulva and vagina: Secondary | ICD-10-CM

## 2018-01-22 DIAGNOSIS — B3731 Acute candidiasis of vulva and vagina: Secondary | ICD-10-CM

## 2018-01-22 MED ORDER — FLUCONAZOLE 150 MG PO TABS: 150.0000 mg | ORAL_TABLET | Freq: Once | ORAL | 0 refills | Status: AC

## 2018-01-22 NOTE — Telephone Encounter (Signed)
Tried to contact patient and phone didn't work

## 2018-01-22 NOTE — Progress Notes (Signed)
Rx for Diflucan sent to pharmacy today.  

## 2018-01-22 NOTE — Telephone Encounter (Signed)
-----   Message from Natalie M Stroud, FNP sent at 01/22/2018  3:37 PM EDT ----- Regarding: "Lab Results" Carrie,   Please inform patient that she has a yeast infection, so Rx for Diflucan was sent to pharmacy today. She is to keep follow up appointment.   Thank you.  

## 2018-01-25 NOTE — Telephone Encounter (Signed)
-----   Message from Kallie Locks, FNP sent at 01/22/2018  3:37 PM EDT ----- Regarding: "Lab Results" [name],   Please inform patient that she has a yeast infection, so Rx for Diflucan was sent to pharmacy today. She is to keep follow up appointment.   Thank you.

## 2018-01-25 NOTE — Telephone Encounter (Signed)
Letter mailed to patient as I have been unable to reach by phone. 

## 2018-02-23 ENCOUNTER — Ambulatory Visit: Payer: 59 | Admitting: Family Medicine

## 2018-03-03 ENCOUNTER — Ambulatory Visit: Payer: 59 | Admitting: Family Medicine

## 2018-03-09 ENCOUNTER — Ambulatory Visit: Payer: 59 | Admitting: Family Medicine

## 2018-03-11 ENCOUNTER — Telehealth: Payer: Self-pay

## 2018-03-11 NOTE — Telephone Encounter (Signed)
-----   Message from Kallie LocksNatalie M Stroud, FNP sent at 03/07/2018  3:47 PM EST ----- Regarding: "Paperwork" Lyla SonCarrie,   She has dropped off Disability forms for me to complete. Please contact patient to remind her that it is important for her to follow up regularly. I have only seen her once as a new patient and since then, she has had 2 cancellations. Possibly transportation problems? Work issues? Remind her that she needs to be assessed in office with labs, blood pressures, to establish a plan of care to improve her health status. I was unaware that she was seeking disability.   Please schedule patient for follow up appointment as soon as she can come in to office to follow up and assess.   Thanks.

## 2018-03-11 NOTE — Telephone Encounter (Signed)
Left a vm for patient to call office to schedule appointment.

## 2018-03-15 NOTE — Telephone Encounter (Signed)
Left a vm for patient to callback to schedule. Letter will also be sent for patient to call and contact office

## 2018-03-15 NOTE — Telephone Encounter (Signed)
-----   Message from Natalie M Stroud, FNP sent at 03/07/2018  3:47 PM EST ----- Regarding: "Paperwork" Carrie,   She has dropped off Disability forms for me to complete. Please contact patient to remind her that it is important for her to follow up regularly. I have only seen her once as a new patient and since then, she has had 2 cancellations. Possibly transportation problems? Work issues? Remind her that she needs to be assessed in office with labs, blood pressures, to establish a plan of care to improve her health status. I was unaware that she was seeking disability.   Please schedule patient for follow up appointment as soon as she can come in to office to follow up and assess.   Thanks.  

## 2018-03-30 ENCOUNTER — Ambulatory Visit (INDEPENDENT_AMBULATORY_CARE_PROVIDER_SITE_OTHER): Payer: 59 | Admitting: Family Medicine

## 2018-03-30 ENCOUNTER — Encounter: Payer: Self-pay | Admitting: Family Medicine

## 2018-03-30 VITALS — BP 138/84 | HR 85 | Temp 97.9°F | Ht 71.0 in | Wt 384.0 lb

## 2018-03-30 DIAGNOSIS — Z09 Encounter for follow-up examination after completed treatment for conditions other than malignant neoplasm: Secondary | ICD-10-CM

## 2018-03-30 DIAGNOSIS — E6609 Other obesity due to excess calories: Secondary | ICD-10-CM

## 2018-03-30 DIAGNOSIS — E66813 Obesity, class 3: Secondary | ICD-10-CM

## 2018-03-30 DIAGNOSIS — O99345 Other mental disorders complicating the puerperium: Secondary | ICD-10-CM | POA: Diagnosis not present

## 2018-03-30 DIAGNOSIS — Z6841 Body Mass Index (BMI) 40.0 and over, adult: Secondary | ICD-10-CM

## 2018-03-30 DIAGNOSIS — F419 Anxiety disorder, unspecified: Secondary | ICD-10-CM | POA: Diagnosis not present

## 2018-03-30 DIAGNOSIS — F53 Postpartum depression: Secondary | ICD-10-CM

## 2018-03-30 MED ORDER — BUSPIRONE HCL 15 MG PO TABS: 15.0000 mg | ORAL_TABLET | Freq: Two times a day (BID) | ORAL | 3 refills | Status: DC

## 2018-03-30 MED ORDER — ESCITALOPRAM OXALATE 10 MG PO TABS: 10.0000 mg | ORAL_TABLET | Freq: Every day | ORAL | 3 refills | Status: DC

## 2018-03-30 NOTE — Patient Instructions (Signed)
Preventing Unhealthy Weight Gain, Adult Staying at a healthy weight is important. When fat builds up in your body, you may become overweight or obese. These conditions put you at greater risk for developing certain health problems, such as heart disease, diabetes, sleeping problems, joint problems, and some cancers. Unhealthy weight gain is often the result of making unhealthy choices in what you eat. It is also a result of not getting enough exercise. You can make changes to your lifestyle to prevent obesity and stay as healthy as possible. What nutrition changes can be made? To maintain a healthy weight and prevent obesity:  Eat only as much as your body needs. To do this: ? Pay attention to signs that you are hungry or full. Stop eating as soon as you feel full. ? If you feel hungry, try drinking water first. Drink enough water so your urine is clear or pale yellow. ? Eat smaller portions. ? Look at serving sizes on food labels. Most foods contain more than one serving per container. ? Eat the recommended amount of calories for your gender and activity level. While most active people should eat around 2,000 calories per day, if you are trying to lose weight or are not very active, you main need to eat less calories. Talk to your health care provider or dietitian about how many calories you should eat each day.  Choose healthy foods, such as: ? Fruits and vegetables. Try to fill at least half of your plate at each meal with fruits and vegetables. ? Whole grains, such as whole wheat bread, brown rice, and quinoa. ? Lean meats, such as chicken or fish. ? Other healthy proteins, such as beans, eggs, or tofu. ? Healthy fats, such as nuts, seeds, fatty fish, and olive oil. ? Low-fat or fat-free dairy.  Check food labels and avoid food and drinks that: ? Are high in calories. ? Have added sugar. ? Are high in sodium. ? Have saturated fats or trans fats.  Limit how much you eat of the following  foods: ? Prepackaged meals. ? Fast food. ? Fried foods. ? Processed meat, such as bacon, sausage, and deli meats. ? Fatty cuts of red meat and poultry with skin.  Cook foods in healthier ways, such as by baking, broiling, or grilling.  When grocery shopping, try to shop around the outside of the store. This helps you buy mostly fresh foods and avoid canned and prepackaged foods.  What lifestyle changes can be made?  Exercise at least 30 minutes 5 or more days each week. Exercising includes brisk walking, yard work, biking, running, swimming, and team sports like basketball and soccer. Ask your health care provider which exercises are safe for you.  Do not use any products that contain nicotine or tobacco, such as cigarettes and e-cigarettes. If you need help quitting, ask your health care provider.  Limit alcohol intake to no more than 1 drink a day for nonpregnant women and 2 drinks a day for men. One drink equals 12 oz of beer, 5 oz of wine, or 1 oz of hard liquor.  Try to get 7-9 hours of sleep each night. What other changes can be made?  Keep a food and activity journal to keep track of: ? What you ate and how many calories you had. Remember to count sauces, dressings, and side dishes. ? Whether you were active, and what exercises you did. ? Your calorie, weight, and activity goals.  Check your weight regularly. Track any changes.   If you notice you have gained weight, make changes to your diet or activity routine.  Avoid taking weight-loss medicines or supplements. Talk to your health care provider before starting any new medicine or supplement.  Talk to your health care provider before trying any new diet or exercise plan. Why are these changes important? Eating healthy, staying active, and having healthy habits not only help prevent obesity, they also:  Help you to manage stress and emotions.  Help you to connect with friends and family.  Improve your  self-esteem.  Improve your sleep.  Prevent long-term health problems.  What can happen if changes are not made? Being obese or overweight can cause you to develop joint or bone problems, which can make it hard for you to stay active or do activities you enjoy. Being obese or overweight also puts stress on your heart and lungs and can lead to health problems like diabetes, heart disease, and some cancers. Where to find more information: Talk with your health care provider or a dietitian about healthy eating and healthy lifestyle choices. You may also find other information through these resources:  [place] Department of Agriculture MyPlate: https://ball-collins.biz/  American Heart Association: www.heart.org  Centers for Disease Control and Prevention: FootballExhibition.com.br  Summary  Staying at a healthy weight is important. It helps prevent certain diseases and health problems, such as heart disease, diabetes, joint problems, sleep disorders, and some cancers.  Being obese or overweight can cause you to develop joint or bone problems, which can make it hard for you to stay active or do activities you enjoy.  You can prevent unhealthy weight gain by eating a healthy diet, exercising regularly, not smoking, limiting alcohol, and getting enough sleep.  Talk with your health care provider or a dietitian for guidance about healthy eating and healthy lifestyle choices. This information is not intended to replace advice given to you by your health care provider. Make sure you discuss any questions you have with your health care provider. Document Released: 04/08/2016 Document Revised: 05/14/2016 Document Reviewed: 05/14/2016 Elsevier Interactive Patient Education  2018 [employer]. DASH Eating Plan DASH stands for "Dietary Approaches to Stop Hypertension." The DASH eating plan is a healthy eating plan that has been shown to reduce high blood pressure (hypertension). It may also reduce your risk for type 2  diabetes, heart disease, and stroke. The DASH eating plan may also help with weight loss. What are tips for following this plan? General guidelines  Avoid eating more than 2,300 mg (milligrams) of salt (sodium) a day. If you have hypertension, you may need to reduce your sodium intake to 1,500 mg a day.  Limit alcohol intake to no more than 1 drink a day for nonpregnant women and 2 drinks a day for men. One drink equals 12 oz of beer, 5 oz of wine, or 1 oz of hard liquor.  Work with your health care provider to maintain a healthy body weight or to lose weight. Ask what an ideal weight is for you.  Get at least 30 minutes of exercise that causes your heart to beat faster (aerobic exercise) most days of the week. Activities may include walking, swimming, or biking.  Work with your health care provider or diet and nutrition specialist (dietitian) to adjust your eating plan to your individual calorie needs. Reading food labels  Check food labels for the amount of sodium per serving. Choose foods with less than 5 percent of the Daily Value of sodium. Generally, foods with less  than 300 mg of sodium per serving fit into this eating plan.  To find whole grains, look for the word "whole" as the first word in the ingredient list. Shopping  Buy products labeled as "low-sodium" or "no salt added."  Buy fresh foods. Avoid canned foods and premade or frozen meals. Cooking  Avoid adding salt when cooking. Use salt-free seasonings or herbs instead of table salt or sea salt. Check with your health care provider or pharmacist before using salt substitutes.  Do not fry foods. Cook foods using healthy methods such as baking, boiling, grilling, and broiling instead.  Cook with heart-healthy oils, such as olive, canola, soybean, or sunflower oil. Meal planning   Eat a balanced diet that includes: ? 5 or more servings of fruits and vegetables each day. At each meal, try to fill half of your plate with  fruits and vegetables. ? Up to 6-8 servings of whole grains each day. ? Less than 6 oz of lean meat, poultry, or fish each day. A 3-oz serving of meat is about the same size as a deck of cards. One egg equals 1 oz. ? 2 servings of low-fat dairy each day. ? A serving of nuts, seeds, or beans 5 times each week. ? Heart-healthy fats. Healthy fats called Omega-3 fatty acids are found in foods such as flaxseeds and coldwater fish, like sardines, salmon, and mackerel.  Limit how much you eat of the following: ? Canned or prepackaged foods. ? Food that is high in trans fat, such as fried foods. ? Food that is high in saturated fat, such as fatty meat. ? Sweets, desserts, sugary drinks, and other foods with added sugar. ? Full-fat dairy products.  Do not salt foods before eating.  Try to eat at least 2 vegetarian meals each week.  Eat more home-cooked food and less restaurant, buffet, and fast food.  When eating at a restaurant, ask that your food be prepared with less salt or no salt, if possible. What foods are recommended? The items listed may not be a complete list. Talk with your dietitian about what dietary choices are best for you. Grains Whole-grain or whole-wheat bread. Whole-grain or whole-wheat pasta. Brown rice. Orpah Cobb. Bulgur. Whole-grain and low-sodium cereals. Pita bread. Low-fat, low-sodium crackers. Whole-wheat flour tortillas. Vegetables Fresh or frozen vegetables (raw, steamed, roasted, or grilled). Low-sodium or reduced-sodium tomato and vegetable juice. Low-sodium or reduced-sodium tomato sauce and tomato paste. Low-sodium or reduced-sodium canned vegetables. Fruits All fresh, dried, or frozen fruit. Canned fruit in natural juice (without added sugar). Meat and other protein foods Skinless chicken or [place]. Ground chicken or [place]. Pork with fat trimmed off. Fish and seafood. Egg whites. Dried beans, peas, or lentils. Unsalted nuts, nut butters, and seeds.  Unsalted canned beans. Lean cuts of beef with fat trimmed off. Low-sodium, lean deli meat. Dairy Low-fat (1%) or fat-free (skim) milk. Fat-free, low-fat, or reduced-fat cheeses. Nonfat, low-sodium ricotta or cottage cheese. Low-fat or nonfat yogurt. Low-fat, low-sodium cheese. Fats and oils Soft margarine without trans fats. Vegetable oil. Low-fat, reduced-fat, or light mayonnaise and salad dressings (reduced-sodium). Canola, safflower, olive, soybean, and sunflower oils. Avocado. Seasoning and other foods Herbs. Spices. Seasoning mixes without salt. Unsalted popcorn and pretzels. Fat-free sweets. What foods are not recommended? The items listed may not be a complete list. Talk with your dietitian about what dietary choices are best for you. Grains Baked goods made with fat, such as croissants, muffins, or some breads. Dry pasta or rice meal packs.  Vegetables Creamed or fried vegetables. Vegetables in a cheese sauce. Regular canned vegetables (not low-sodium or reduced-sodium). Regular canned tomato sauce and paste (not low-sodium or reduced-sodium). Regular tomato and vegetable juice (not low-sodium or reduced-sodium). Rosita FirePickles. Olives. Fruits Canned fruit in a light or heavy syrup. Fried fruit. Fruit in cream or butter sauce. Meat and other protein foods Fatty cuts of meat. Ribs. Fried meat. Tomasa BlaseBacon. Sausage. Bologna and other processed lunch meats. Salami. Fatback. Hotdogs. Bratwurst. Salted nuts and seeds. Canned beans with added salt. Canned or smoked fish. Whole eggs or egg yolks. Chicken or [place] with skin. Dairy Whole or 2% milk, cream, and half-and-half. Whole or full-fat cream cheese. Whole-fat or sweetened yogurt. Full-fat cheese. Nondairy creamers. Whipped toppings. Processed cheese and cheese spreads. Fats and oils Butter. Stick margarine. Lard. Shortening. Ghee. Bacon fat. Tropical oils, such as coconut, palm kernel, or palm oil. Seasoning and other foods Salted popcorn and  pretzels. Onion salt, garlic salt, seasoned salt, table salt, and sea salt. Worcestershire sauce. Tartar sauce. Barbecue sauce. Teriyaki sauce. Soy sauce, including reduced-sodium. Steak sauce. Canned and packaged gravies. Fish sauce. Oyster sauce. Cocktail sauce. Horseradish that you find on the shelf. Ketchup. Mustard. Meat flavorings and tenderizers. Bouillon cubes. Hot sauce and Tabasco sauce. Premade or packaged marinades. Premade or packaged taco seasonings. Relishes. Regular salad dressings. Where to find more information:  National Heart, Lung, and Blood Institute: PopSteam.iswww.nhlbi.nih.gov  American Heart Association: www.heart.org Summary  The DASH eating plan is a healthy eating plan that has been shown to reduce high blood pressure (hypertension). It may also reduce your risk for type 2 diabetes, heart disease, and stroke.  With the DASH eating plan, you should limit salt (sodium) intake to 2,300 mg a day. If you have hypertension, you may need to reduce your sodium intake to 1,500 mg a day.  When on the DASH eating plan, aim to eat more fresh fruits and vegetables, whole grains, lean proteins, low-fat dairy, and heart-healthy fats.  Work with your health care provider or diet and nutrition specialist (dietitian) to adjust your eating plan to your individual calorie needs. This information is not intended to replace advice given to you by your health care provider. Make sure you discuss any questions you have with your health care provider. Document Released: 03/27/2011 Document Revised: 03/31/2016 Document Reviewed: 03/31/2016 Elsevier Interactive Patient Education  2018 [employer] Heart-Healthy Eating Plan Heart-healthy meal planning includes:  Limiting unhealthy fats.  Increasing healthy fats.  Making other small dietary changes.  You may need to talk with your doctor or a diet specialist (dietitian) to create an eating plan that is right for you. What types of fat should I  choose?  Choose healthy fats. These include olive oil and canola oil, flaxseeds, walnuts, almonds, and seeds.  Eat more omega-3 fats. These include salmon, mackerel, sardines, tuna, flaxseed oil, and ground flaxseeds. Try to eat fish at least twice each week.  Limit saturated fats. ? Saturated fats are often found in animal products, such as meats, butter, and cream. ? Plant sources of saturated fats include palm oil, palm kernel oil, and coconut oil.  Avoid foods with partially hydrogenated oils in them. These include stick margarine, some tub margarines, cookies, crackers, and other baked goods. These contain trans fats. What general guidelines do I need to follow?  Check food labels carefully. Identify foods with trans fats or high amounts of saturated fat.  Fill one half of your plate with vegetables and green salads. Eat 4-5  servings of vegetables per day. A serving of vegetables is: ? 1 cup of raw leafy vegetables. ?  cup of raw or cooked cut-up vegetables. ?  cup of vegetable juice.  Fill one fourth of your plate with whole grains. Look for the word "whole" as the first word in the ingredient list.  Fill one fourth of your plate with lean protein foods.  Eat 4-5 servings of fruit per day. A serving of fruit is: ? One medium whole fruit. ?  cup of dried fruit. ?  cup of fresh, frozen, or canned fruit. ?  cup of 100% fruit juice.  Eat more foods that contain soluble fiber. These include apples, broccoli, carrots, beans, peas, and barley. Try to get 20-30 g of fiber per day.  Eat more home-cooked food. Eat less restaurant, buffet, and fast food.  Limit or avoid alcohol.  Limit foods high in starch and sugar.  Avoid fried foods.  Avoid frying your food. Try baking, boiling, grilling, or broiling it instead. You can also reduce fat by: ? Removing the skin from poultry. ? Removing all visible fats from meats. ? Skimming the fat off of stews, soups, and gravies before  serving them. ? Steaming vegetables in water or broth.  Lose weight if you are overweight.  Eat 4-5 servings of nuts, legumes, and seeds per week: ? One serving of dried beans or legumes equals  cup after being cooked. ? One serving of nuts equals 1 ounces. ? One serving of seeds equals  ounce or one tablespoon.  You may need to keep track of how much salt or sodium you eat. This is especially true if you have high blood pressure. Talk with your doctor or dietitian to get more information. What foods can I eat? Grains Breads, including [place], white, pita, wheat, raisin, rye, oatmeal, and [place]. Tortillas that are neither fried nor made with lard or trans fat. Low-fat rolls, including hotdog and hamburger buns and English muffins. Biscuits. Muffins. Waffles. Pancakes. Light popcorn. Whole-grain cereals. Flatbread. Melba toast. Pretzels. Breadsticks. Rusks. Low-fat snacks. Low-fat crackers, including oyster, saltine, matzo, graham, animal, and rye. Rice and pasta, including brown rice and pastas that are made with whole wheat. Vegetables All vegetables. Fruits All fruits, but limit coconut. Meats and Other Protein Sources Lean, well-trimmed beef, veal, pork, and lamb. Chicken and [place] without skin. All fish and shellfish. Wild duck, rabbit, pheasant, and venison. Egg whites or low-cholesterol egg substitutes. Dried beans, peas, lentils, and tofu. Seeds and most nuts. Dairy Low-fat or nonfat cheeses, including ricotta, string, and mozzarella. Skim or 1% milk that is liquid, powdered, or evaporated. Buttermilk that is made with low-fat milk. Nonfat or low-fat yogurt. Beverages Mineral water. Diet carbonated beverages. Sweets and Desserts Sherbets and fruit ices. Honey, jam, marmalade, jelly, and syrups. Meringues and gelatins. Pure sugar candy, such as hard candy, jelly beans, gumdrops, mints, marshmallows, and small amounts of dark chocolate. [employer]. Eat all sweets and  desserts in moderation. Fats and Oils Nonhydrogenated (trans-free) margarines. Vegetable oils, including soybean, sesame, sunflower, olive, peanut, safflower, corn, canola, and cottonseed. Salad dressings or mayonnaise made with a vegetable oil. Limit added fats and oils that you use for cooking, baking, salads, and as spreads. Other Cocoa powder. Coffee and tea. All seasonings and condiments. The items listed above may not be a complete list of recommended foods or beverages. Contact your dietitian for more options. What foods are not recommended? Grains Breads that are made with saturated or  trans fats, oils, or whole milk. Croissants. Butter rolls. Cheese breads. Sweet rolls. Donuts. Buttered popcorn. Chow mein noodles. High-fat crackers, such as cheese or butter crackers. Meats and Other Protein Sources Fatty meats, such as hotdogs, short ribs, sausage, spareribs, bacon, rib eye roast or steak, and mutton. High-fat deli meats, such as salami and bologna. Caviar. Domestic duck and goose. Organ meats, such as kidney, liver, sweetbreads, and heart. Dairy Cream, sour cream, cream cheese, and creamed cottage cheese. Whole-milk cheeses, including blue (bleu), [address], [place], [place], [address], [place], [address], cheddar, [place], and [place]. Whole or 2% milk that is liquid, evaporated, or condensed. Whole buttermilk. Cream sauce or high-fat cheese sauce. Yogurt that is made from whole milk. Beverages Regular sodas and juice drinks with added sugar. Sweets and Desserts Frosting. Pudding. Cookies. Cakes other than angel food cake. Candy that has milk chocolate or white chocolate, hydrogenated fat, butter, coconut, or unknown ingredients. Buttered syrups. Full-fat ice cream or ice cream drinks. Fats and Oils Gravy that has suet, meat fat, or shortening. Cocoa butter, hydrogenated oils, palm oil, coconut oil, palm kernel oil. These can often be found in baked products, candy, fried foods, nondairy  creamers, and whipped toppings. Solid fats and shortenings, including bacon fat, salt pork, lard, and butter. Nondairy cream substitutes, such as coffee creamers and sour cream substitutes. Salad dressings that are made of unknown oils, cheese, or sour cream. The items listed above may not be a complete list of foods and beverages to avoid. Contact your dietitian for more information. This information is not intended to replace advice given to you by your health care provider. Make sure you discuss any questions you have with your health care provider. Document Released: 10/07/2011 Document Revised: 09/13/2015 Document Reviewed: 09/29/2013 Elsevier Interactive Patient Education  [employer].

## 2018-03-30 NOTE — Progress Notes (Signed)
Follow Up  Subjective:    Patient ID: April Gregory, female    DOB: 1981-02-08, 37 y.o.   MRN: [id number]  Chief Complaint  Patient presents with  . Follow-up    Anxiety  . Insomnia   HPI  Ms. April Gregory is a 37 year old female with a past medical history of Pregnancy Induced Hypertension, Anxiety, and Anemia. She is here today for follow up.  Current Status: Since her last office visit, she is doing well with no complaints. She denies visual changes, chest pain, cough, shortness of breath, heart palpitations, and falls. She has occasionally headaches and dizziness with position changes. Denies severe headaches, confusion, seizures, double vision, and blurred vision, nausea and vomiting.Her anxiety is improving, denies suicidal ideations, homicidal ideations, or auditory hallucinations.   She denies pain today. She denies fevers, chills, fatigue, recent infections, weight loss, and night sweats. No reports of GI problems such as nausea, vomiting, diarrhea, and constipation. She has no reports of blood in stools, dysuria and hematuria.   Review of Systems  Constitutional: Negative.   HENT: Negative.   Eyes: Negative.   Respiratory: Negative.   Cardiovascular: Negative.   Gastrointestinal: Positive for abdominal distention (Obese).  Endocrine: Negative.   Genitourinary: Negative.   Musculoskeletal: Negative.   Skin: Negative.   Allergic/Immunologic: Negative.   Neurological: Positive for dizziness (Occasional) and headaches (Occasional).  Hematological: Negative.   Psychiatric/Behavioral: Negative.    Objective:   Physical Exam  Constitutional: She is oriented to person, place, and time. She appears well-developed and well-nourished.  HENT:  Head: Normocephalic and atraumatic.  Eyes: Pupils are equal, round, and reactive to light. Conjunctivae and EOM are normal.  Neck: Normal range of motion. Neck supple.  Cardiovascular: Normal rate, regular rhythm, normal heart sounds and  intact distal pulses.  Pulmonary/Chest: Effort normal and breath sounds normal.  Abdominal: Soft. Bowel sounds are normal.  Musculoskeletal: Normal range of motion.  Neurological: She is alert and oriented to person, place, and time.  Skin: Skin is warm and dry.  Psychiatric: She has a normal mood and affect. Her behavior is normal. Judgment and thought content normal.  Nursing note and vitals reviewed.  Assessment & Plan:   1. Anxiety We will increase Buspar to 15 mg BID.  - busPIRone (BUSPAR) 15 MG tablet; Take 1 tablet (15 mg total) by mouth 2 (two) times daily.  Dispense: 60 tablet; Refill: 3 - escitalopram (LEXAPRO) 10 MG tablet; Take 1 tablet (10 mg total) by mouth daily.  Dispense: 30 tablet; Refill: 3  2. Postpartum depression - busPIRone (BUSPAR) 15 MG tablet; Take 1 tablet (15 mg total) by mouth 2 (two) times daily.  Dispense: 60 tablet; Refill: 3 - escitalopram (LEXAPRO) 10 MG tablet; Take 1 tablet (10 mg total) by mouth daily.  Dispense: 30 tablet; Refill: 3  4. Obesity due to excess calories with serious comorbidity, unspecified classification - Referral to Nutrition and Diabetes Services  5. Class 3 severe obesity due to excess calories without serious comorbidity with body mass index (BMI) of 50.0 to 59.9 in adult [facility](HCC) Body mass index is 53.56 kg/m. Goal BMI  is <30. Encouraged efforts to reduce weight include engaging in physical activity as tolerated with goal of 150 minutes per week. Improve dietary choices and eat a meal regimen consistent with a Mediterranean or DASH diet. Reduce simple carbohydrates. Do not skip meals and eat healthy snacks throughout the day to avoid over-eating at dinner. Set a goal weight loss that is  achievable for you.  6. Follow up She will follow up in 3 months.   Meds ordered this encounter  Medications  . busPIRone (BUSPAR) 15 MG tablet    Sig: Take 1 tablet (15 mg total) by mouth 2 (two) times daily.    Dispense:  60 tablet     Refill:  3  . escitalopram (LEXAPRO) 10 MG tablet    Sig: Take 1 tablet (10 mg total) by mouth daily.    Dispense:  30 tablet    Refill:  3    Referral Orders     Referral to Nutrition and Diabetes Services  Raliegh Ip,  MSN, FNP-C [facility] [address] [place], [place] [address] [phone]

## 2018-03-31 ENCOUNTER — Other Ambulatory Visit: Payer: Self-pay | Admitting: Family Medicine

## 2018-03-31 DIAGNOSIS — O99345 Other mental disorders complicating the puerperium: Principal | ICD-10-CM

## 2018-03-31 DIAGNOSIS — F53 Postpartum depression: Secondary | ICD-10-CM

## 2018-04-23 ENCOUNTER — Ambulatory Visit: Payer: 59 | Admitting: Registered"

## 2018-04-26 ENCOUNTER — Telehealth: Payer: Self-pay

## 2018-04-26 DIAGNOSIS — F419 Anxiety disorder, unspecified: Secondary | ICD-10-CM

## 2018-04-26 DIAGNOSIS — O99345 Other mental disorders complicating the puerperium: Principal | ICD-10-CM

## 2018-04-26 DIAGNOSIS — F53 Postpartum depression: Secondary | ICD-10-CM

## 2018-04-27 MED ORDER — ESCITALOPRAM OXALATE 10 MG PO TABS: 10.0000 mg | ORAL_TABLET | Freq: Every day | ORAL | 3 refills | Status: DC

## 2018-04-27 NOTE — Telephone Encounter (Signed)
Medication refilled

## 2018-06-29 ENCOUNTER — Ambulatory Visit: Payer: 59 | Admitting: Family Medicine

## 2018-10-20 DIAGNOSIS — G4700 Insomnia, unspecified: Secondary | ICD-10-CM

## 2018-10-20 HISTORY — DX: Insomnia, unspecified: G47.00

## 2018-11-03 ENCOUNTER — Ambulatory Visit: Payer: 59 | Admitting: Family Medicine

## 2018-11-09 ENCOUNTER — Encounter: Payer: Self-pay | Admitting: Family Medicine

## 2018-11-09 ENCOUNTER — Other Ambulatory Visit: Payer: Self-pay

## 2018-11-09 ENCOUNTER — Ambulatory Visit (INDEPENDENT_AMBULATORY_CARE_PROVIDER_SITE_OTHER): Payer: 59 | Admitting: Family Medicine

## 2018-11-09 VITALS — BP 132/80 | HR 80 | Temp 98.0°F | Ht 71.0 in | Wt 387.0 lb

## 2018-11-09 DIAGNOSIS — G4700 Insomnia, unspecified: Secondary | ICD-10-CM | POA: Insufficient documentation

## 2018-11-09 DIAGNOSIS — Z6841 Body Mass Index (BMI) 40.0 and over, adult: Secondary | ICD-10-CM

## 2018-11-09 DIAGNOSIS — Z09 Encounter for follow-up examination after completed treatment for conditions other than malignant neoplasm: Secondary | ICD-10-CM

## 2018-11-09 DIAGNOSIS — E66813 Obesity, class 3: Secondary | ICD-10-CM

## 2018-11-09 DIAGNOSIS — F419 Anxiety disorder, unspecified: Secondary | ICD-10-CM | POA: Diagnosis not present

## 2018-11-09 MED ORDER — TRAZODONE HCL 50 MG PO TABS: 25.0000 mg | ORAL_TABLET | Freq: Every evening | ORAL | 3 refills | Status: DC | PRN

## 2018-11-09 MED ORDER — BUPROPION HCL 75 MG PO TABS: 75.0000 mg | ORAL_TABLET | Freq: Two times a day (BID) | ORAL | 1 refills | Status: DC

## 2018-11-09 NOTE — Progress Notes (Signed)
Patient April Gregory and April Gregory   Sick Visit  Subjective:  Patient ID: April Gregory, female    DOB: 1980-05-10  Age: 39 y.o. MRN: [id number]  CC:  Chief Complaint  Patient presents with   Follow-up    Anxiety     HPI April Gregory is a 38 year old female who presents for Sick Visit today.  Past Medical History:  Diagnosis Date   Anemia    Anxiety    Infection    UTI   Insomnia 10/2018   MRSA (methicillin resistant staph aureus) culture positive    Pregnancy induced hypertension    Spinal headache    Vaginal Pap smear, abnormal    Current Status: Since her last office visit, she is doing well with no complaints. Her anxiety is increased today. She has been taking Buspar consistently, but states that it continues to make her feel sick. She continues to follow up with Psychiatry regularly, since 2017. She denies suicidal ideations, homicidal ideations, or auditory hallucinations. She continues to have trouble sleeping.   She denies fevers, chills, fatigue, recent infections, weight loss, and night sweats. She has not had any headaches, visual changes, dizziness, and falls. No chest pain, heart palpitations, cough and shortness of breath reported. No reports of GI problems such as nausea, vomiting, diarrhea, and constipation. She has no reports of blood in stools, dysuria and hematuria. She denies pain today.   Past Surgical History:  Procedure Laterality Date   ABDOMINAL HYSTERECTOMY     CESAREAN SECTION     COLPOSCOPY      Family History  Problem Relation Age of Onset   Cancer Mother    Hypertension Mother    Arthritis Mother    Learning disabilities Son    Arthritis Maternal Grandmother    Cancer Maternal Grandmother     Social History   Socioeconomic History   Marital status: Married    Spouse name: Not on file   Number of children: Not on file   Years of education: Not on file   Highest education  level: Not on file  Occupational History   Not on file  Social Needs   Financial resource strain: Not on file   Food insecurity    Worry: Not on file    Inability: Not on file   Transportation needs    Medical: Not on file    Non-medical: Not on file  Tobacco Use   Smoking status: Never Smoker   Smokeless tobacco: Never Used  Substance and Sexual Activity   Alcohol use: No    Alcohol/week: 0.0 standard drinks    Comment: social   Drug use: No   Sexual activity: Yes    Partners: Male    Birth control/protection: None  Lifestyle   Physical activity    Days per week: Not on file    Minutes per session: Not on file   Stress: Not on file  Relationships   Social connections    Talks on phone: Not on file    Gets together: Not on file    Attends religious service: Not on file    Active member of club or organization: Not on file    Attends meetings of clubs or organizations: Not on file    Relationship status: Not on file   Intimate partner violence    Fear of current or ex partner: Not on file    Emotionally abused: Not on file  Physically abused: Not on file    Forced sexual activity: Not on file  Other Topics Concern   Not on file  Social History Narrative   Not on file    Outpatient Medications Prior to Visit  Medication Sig Dispense Refill   busPIRone (BUSPAR) 15 MG tablet Take 1 tablet (15 mg total) by mouth 2 (two) times daily. 60 tablet 3   ferrous sulfate 325 (65 FE) MG tablet Take 1 tablet (325 mg total) by mouth daily with breakfast. (Patient not taking: Reported on 03/30/2018) 30 tablet 3   escitalopram (LEXAPRO) 10 MG tablet Take 1 tablet (10 mg total) by mouth daily. (Patient not taking: Reported on 11/09/2018) 30 tablet 3   No facility-administered medications prior to visit.     Allergies  Allergen Reactions   Flagyl [Metronidazole] Hives   Other Shortness Of Breath    Pt allergic to Salmon, causes SOB and itching. Pt also  allergic to ant bites causes itching, swelling, hives.   Bactrim [Sulfamethoxazole-Trimethoprim] Itching   Penicillins Other (See Comments)    Has patient had a PCN reaction causing immediate rash, facial/tongue/throat swelling, SOB or lightheadedness with hypotension: No Has patient had a PCN reaction causing severe rash involving mucus membranes or skin necrosis: No Has patient had a PCN reaction that required hospitalization No Has patient had a PCN reaction occurring within the last 10 years:yes...5-6 years ago If all of the above answers are "NO", then may proceed with Cephalosporin use.    ROS Review of Systems  Constitutional: Negative.   HENT: Negative.   Eyes: Negative.   Respiratory: Negative.   Cardiovascular: Negative.   Gastrointestinal: Positive for abdominal distention (obese).  Endocrine: Negative.   Genitourinary: Negative.   Musculoskeletal: Negative.   Skin: Negative.   Allergic/Immunologic: Negative.   Neurological: Negative.   Hematological: Negative.   Psychiatric/Behavioral: Negative.       Objective:    Physical Exam  Constitutional: She is oriented to person, place, and time. She appears well-developed and well-nourished.  HENT:  Head: Normocephalic and atraumatic.  Eyes: Conjunctivae are normal.  Neck: Normal range of motion. Neck supple.  Cardiovascular: Normal rate, regular rhythm, normal heart sounds and intact distal pulses.  Pulmonary/Chest: Effort normal and breath sounds normal.  Abdominal: Soft. Bowel sounds are normal. She exhibits distension (obese).  Musculoskeletal: Normal range of motion.  Neurological: She is alert and oriented to person, place, and time. She has normal reflexes.  Skin: Skin is warm and dry.  Psychiatric: She has a normal mood and affect. Her behavior is normal. Judgment and thought content normal.  Nursing note and vitals reviewed.   BP 132/80 (BP Location: Left Wrist, Patient Position: Sitting, Cuff Size:  Large)    Pulse 80    Temp 98 F (36.7 C) (Oral)    Ht 5\' 11"  (1.803 m)    Wt (!) 387 lb (175.5 kg)    LMP 11/01/2016 (Approximate)    SpO2 99%    BMI 53.98 kg/m  Wt Readings from Last 3 Encounters:  11/09/18 (!) 387 lb (175.5 kg)  03/30/18 (!) 384 lb (174.2 kg)  01/12/18 (!) 375 lb (170.1 kg)     There are no preventive Gregory reminders to display for this patient.  There are no preventive Gregory reminders to display for this patient.  No results found for: TSH Lab Results  Component Value Date   WBC 9.8 12/15/2017   HGB 8.7 (L) 12/15/2017   HCT 29.0 (L) 12/15/2017  MCV 61.6 (L) 12/15/2017   PLT 349 12/15/2017   Lab Results  Component Value Date   NA 132 (L) 12/15/2017   K 3.3 (L) 12/15/2017   CO2 23 12/15/2017   GLUCOSE 91 12/15/2017   BUN 10 12/15/2017   CREATININE 0.94 12/15/2017   BILITOT 0.9 12/15/2017   ALKPHOS 83 12/15/2017   AST 28 12/15/2017   ALT 23 12/15/2017   PROT 8.4 (H) 12/15/2017   ALBUMIN 3.5 12/15/2017   CALCIUM 8.4 (L) 12/15/2017   ANIONGAP 9 12/15/2017   No results found for: CHOL No results found for: HDL No results found for: LDLCALC No results found for: TRIG No results found for: [facility] Lab Results  Component Value Date   HGBA1C 5.3 01/12/2018      Assessment & Plan:   1. Anxiety Unable to tolerate Buspar. We will initiate Wellbutrin today.  - buPROPion (WELLBUTRIN) 75 MG tablet; Take 1 tablet (75 mg total) by mouth 2 (two) times daily.  Dispense: 60 tablet; Refill: 1  2. Class 3 severe obesity due to excess calories without serious comorbidity with body mass index (BMI) of 50.0 to 59.9 in adult [facility](HCC) Body mass index is 53.98 kg/m.  Goal BMI  is <30. Encouraged efforts to reduce weight include engaging in physical activity as tolerated with goal of 150 minutes per week. Improve dietary choices and eat a meal regimen consistent with a Mediterranean or DASH diet. Reduce simple carbohydrates. Do not skip meals and eat healthy snacks  throughout the day to avoid over-eating at dinner. Set a goal weight loss that is achievable for you.  3. Insomnia, unspecified type We will initiate Trazodone today.  - traZODone (DESYREL) 50 MG tablet; Take 0.5-1 tablets (25-50 mg total) by mouth at bedtime as needed for sleep.  Dispense: 30 tablet; Refill: 3  4. Follow up She will follow up in 2 months.   Meds ordered this encounter  Medications   buPROPion (WELLBUTRIN) 75 MG tablet    Sig: Take 1 tablet (75 mg total) by mouth 2 (two) times daily.    Dispense:  60 tablet    Refill:  1   traZODone (DESYREL) 50 MG tablet    Sig: Take 0.5-1 tablets (25-50 mg total) by mouth at bedtime as needed for sleep.    Dispense:  30 tablet    Refill:  3    No orders of the defined types were placed in this encounter.   Referral Orders  No referral(s) requested today    Raliegh IpNatalie Jerson Furukawa,  MSN, FNP-BC [facility] [address]  [place], [place] [address] [phone] fax   Problem List Items Addressed This Visit      Other   Obesity, unspecified    Other Visit Diagnoses    Anxiety    -  Primary   Relevant Medications   buPROPion (WELLBUTRIN) 75 MG tablet   traZODone (DESYREL) 50 MG tablet   Insomnia, unspecified type       Relevant Medications   traZODone (DESYREL) 50 MG tablet   Follow up          Meds ordered this encounter  Medications   buPROPion (WELLBUTRIN) 75 MG tablet    Sig: Take 1 tablet (75 mg total) by mouth 2 (two) times daily.    Dispense:  60 tablet    Refill:  1   traZODone (DESYREL) 50 MG tablet    Sig: Take 0.5-1 tablets (  25-50 mg total) by mouth at bedtime as needed for sleep.    Dispense:  30 tablet    Refill:  3    Follow-up: Return in about 2 months (around 01/10/2019).    Kallie LocksNatalie M Kedron Uno, FNP

## 2018-11-09 NOTE — Patient Instructions (Addendum)
Bupropion tablets (Depression/Mood Disorders) What is this medicine? BUPROPION (byoo PROE pee on) is used to treat depression. This medicine may be used for other purposes; ask your health care provider or pharmacist if you have questions. COMMON BRAND NAME(S): Wellbutrin What should I tell my health care provider before I take this medicine? They need to know if you have any of these conditions:  an eating disorder, such as anorexia or bulimia  bipolar disorder or psychosis  diabetes or high blood sugar, treated with medication  glaucoma  heart disease, previous heart attack, or irregular heart beat  head injury or brain tumor  high blood pressure  kidney or liver disease  seizures  suicidal thoughts or a previous suicide attempt  Tourette's syndrome  weight loss  an unusual or allergic reaction to bupropion, other medicines, foods, dyes, or preservatives  breast-feeding  pregnant or trying to become pregnant How should I use this medicine? Take this medicine by mouth with a glass of water. Follow the directions on the prescription label. You can take it with or without food. If it upsets your stomach, take it with food. Take your medicine at regular intervals. Do not take your medicine more often than directed. Do not stop taking this medicine suddenly except upon the advice of your doctor. Stopping this medicine too quickly may cause serious side effects or your condition may worsen. A special MedGuide will be given to you by the pharmacist with each prescription and refill. Be sure to read this information carefully each time. Talk to your pediatrician regarding the use of this medicine in children. Special care may be needed. Overdosage: If you think you have taken too much of this medicine contact a poison control center or emergency room at once. NOTE: This medicine is only for you. Do not share this medicine with others. What if I miss a dose? If you miss a dose,  take it as soon as you can. If it is less than four hours to your next dose, take only that dose and skip the missed dose. Do not take double or extra doses. What may interact with this medicine? Do not take this medicine with any of the following medications:  linezolid  MAOIs like Azilect, Carbex, Eldepryl, Marplan, Nardil, and Parnate  methylene blue (injected into a vein)  other medicines that contain bupropion like Zyban This medicine may also interact with the following medications:  alcohol  certain medicines for anxiety or sleep  certain medicines for blood pressure like metoprolol, propranolol  certain medicines for depression or psychotic disturbances  certain medicines for HIV or AIDS like efavirenz, lopinavir, nelfinavir, ritonavir  certain medicines for irregular heart beat like propafenone, flecainide  certain medicines for Parkinson's disease like amantadine, levodopa  certain medicines for seizures like carbamazepine, phenytoin, phenobarbital  cimetidine  clopidogrel  cyclophosphamide  digoxin  furazolidone  isoniazid  nicotine  orphenadrine  procarbazine  steroid medicines like prednisone or cortisone  stimulant medicines for attention disorders, weight loss, or to stay awake  tamoxifen  theophylline  thiotepa  ticlopidine  tramadol  warfarin This list may not describe all possible interactions. Give your health care provider a list of all the medicines, herbs, non-prescription drugs, or dietary supplements you use. Also tell them if you smoke, drink alcohol, or use illegal drugs. Some items may interact with your medicine. What should I watch for while using this medicine? Tell your doctor if your symptoms do not get better or if they get worse.   Visit your doctor or healthcare provider for regular checks on your progress. Because it may take several weeks to see the full effects of this medicine, it is important to continue your  treatment as prescribed by your doctor. This medicine may cause serious skin reactions. They can happen weeks to months after starting the medicine. Contact your healthcare provider right away if you notice fevers or flu-like symptoms with a rash. The rash may be red or purple and then turn into blisters or peeling of the skin. Or, you might notice a red rash with swelling of the face, lips or lymph nodes in your neck or under your arms. Patients and their families should watch out for new or worsening thoughts of suicide or depression. Also watch out for sudden changes in feelings such as feeling anxious, agitated, panicky, irritable, hostile, aggressive, impulsive, severely restless, overly excited and hyperactive, or not being able to sleep. If this happens, especially at the beginning of treatment or after a change in dose, call your healthcare provider. Avoid alcoholic drinks while taking this medicine. Drinking excessive alcoholic beverages, using sleeping or anxiety medicines, or quickly stopping the use of these agents while taking this medicine may increase your risk for a seizure. Do not drive or use heavy machinery until you know how this medicine affects you. This medicine can impair your ability to perform these tasks. Do not take this medicine close to bedtime. It may prevent you from sleeping. Your mouth may get dry. Chewing sugarless gum or sucking hard candy, and drinking plenty of water may help. Contact your doctor if the problem does not go away or is severe. What side effects may I notice from receiving this medicine? Side effects that you should report to your doctor or health care professional as soon as possible:  allergic reactions like skin rash, itching or hives, swelling of the face, lips, or tongue  breathing problems  changes in vision  confusion  elevated mood, decreased need for sleep, racing thoughts, impulsive behavior  fast or irregular  heartbeat  hallucinations, loss of contact with reality  increased blood pressure  rash, fever, and swollen lymph nodes  redness, blistering, peeling, or loosening of the skin, including inside the mouth  seizures  suicidal thoughts or other mood changes  unusually weak or tired  vomiting Side effects that usually do not require medical attention (report to your doctor or health care professional if they continue or are bothersome):  constipation  headache  loss of appetite  nausea  tremors  weight loss This list may not describe all possible side effects. Call your doctor for medical advice about side effects. You may report side effects to FDA at 1-800-FDA-1088. Where should I keep my medicine? Keep out of the reach of children. Store at room temperature between 20 and 25 degrees C (68 and 77 degrees F), away from direct sunlight and moisture. Keep tightly closed. Throw away any unused medicine after the expiration date. NOTE: This sheet is a summary. It may not cover all possible information. If you have questions about this medicine, talk to your doctor, pharmacist, or health care provider.  2020 Elsevier/Gold Standard (2018-07-01 14:02:47) Trazodone tablets What is this medicine? TRAZODONE (TRAZ oh done) is used to treat depression. This medicine may be used for other purposes; ask your health care provider or pharmacist if you have questions. COMMON BRAND NAME(S): Desyrel What should I tell my health care provider before I take this medicine? They need to know  if you have any of these conditions:  attempted suicide or thinking about it  bipolar disorder  bleeding problems  glaucoma  heart disease, or previous heart attack  irregular heart beat  kidney or liver disease  low levels of sodium in the blood  an unusual or allergic reaction to trazodone, other medicines, foods, dyes or preservatives  pregnant or trying to get  pregnant  breast-feeding How should I use this medicine? Take this medicine by mouth with a glass of water. Follow the directions on the prescription label. Take this medicine shortly after a meal or a light snack. Take your medicine at regular intervals. Do not take your medicine more often than directed. Do not stop taking this medicine suddenly except upon the advice of your doctor. Stopping this medicine too quickly may cause serious side effects or your condition may worsen. A special MedGuide will be given to you by the pharmacist with each prescription and refill. Be sure to read this information carefully each time. Talk to your pediatrician regarding the use of this medicine in children. Special care may be needed. Overdosage: If you think you have taken too much of this medicine contact a poison control center or emergency room at once. NOTE: This medicine is only for you. Do not share this medicine with others. What if I miss a dose? If you miss a dose, take it as soon as you can. If it is almost time for your next dose, take only that dose. Do not take double or extra doses. What may interact with this medicine? Do not take this medicine with any of the following medications:  certain medicines for fungal infections like fluconazole, itraconazole, ketoconazole, posaconazole, voriconazole  cisapride  dronedarone  linezolid  MAOIs like Carbex, Eldepryl, Marplan, Nardil, and Parnate  mesoridazine  methylene blue (injected into a vein)  pimozide  saquinavir  thioridazine This medicine may also interact with the following medications:  alcohol  antiviral medicines for HIV or AIDS  aspirin and aspirin-like medicines  barbiturates like phenobarbital  certain medicines for blood pressure, heart disease, irregular heart beat  certain medicines for depression, anxiety, or psychotic disturbances  certain medicines for migraine headache like almotriptan, eletriptan,  frovatriptan, naratriptan, rizatriptan, sumatriptan, zolmitriptan  certain medicines for seizures like carbamazepine and phenytoin  certain medicines for sleep  certain medicines that treat or prevent blood clots like dalteparin, enoxaparin, warfarin  digoxin  fentanyl  lithium  NSAIDS, medicines for pain and inflammation, like ibuprofen or naproxen  other medicines that prolong the QT interval (cause an abnormal heart rhythm) like dofetilide  rasagiline  supplements like St. John's wort, kava kava, valerian  tramadol  tryptophan This list may not describe all possible interactions. Give your health care provider a list of all the medicines, herbs, non-prescription drugs, or dietary supplements you use. Also tell them if you smoke, drink alcohol, or use illegal drugs. Some items may interact with your medicine. What should I watch for while using this medicine? Tell your doctor if your symptoms do not get better or if they get worse. Visit your doctor or health care professional for regular checks on your progress. Because it may take several weeks to see the full effects of this medicine, it is important to continue your treatment as prescribed by your doctor. Patients and their families should watch out for new or worsening thoughts of suicide or depression. Also watch out for sudden changes in feelings such as feeling anxious, agitated, panicky, irritable, hostile,  aggressive, impulsive, severely restless, overly excited and hyperactive, or not being able to sleep. If this happens, especially at the beginning of treatment or after a change in dose, call your health care professional. Bonita QuinYou may get drowsy or dizzy. Do not drive, use machinery, or do anything that needs mental alertness until you know how this medicine affects you. Do not stand or sit up quickly, especially if you are an older patient. This reduces the risk of dizzy or fainting spells. Alcohol may interfere with the  effect of this medicine. Avoid alcoholic drinks. This medicine may cause dry eyes and blurred vision. If you wear contact lenses you may feel some discomfort. Lubricating drops may help. See your eye doctor if the problem does not go away or is severe. Your mouth may get dry. Chewing sugarless gum, sucking hard candy and drinking plenty of water may help. Contact your doctor if the problem does not go away or is severe. What side effects may I notice from receiving this medicine? Side effects that you should report to your doctor or health care professional as soon as possible:  allergic reactions like skin rash, itching or hives, swelling of the face, lips, or tongue  elevated mood, decreased need for sleep, racing thoughts, impulsive behavior  confusion  fast, irregular heartbeat  feeling faint or lightheaded, falls  feeling agitated, angry, or irritable  loss of balance or coordination  painful or prolonged erections  restlessness, pacing, inability to keep still  suicidal thoughts or other mood changes  tremors  trouble sleeping  seizures  unusual bleeding or bruising Side effects that usually do not require medical attention (report to your doctor or health care professional if they continue or are bothersome):  change in sex drive or performance  change in appetite or weight  constipation  headache  muscle aches or pains  nausea This list may not describe all possible side effects. Call your doctor for medical advice about side effects. You may report side effects to FDA at 1-800-FDA-1088. Where should I keep my medicine? Keep out of the reach of children. Store at room temperature between 15 and 30 degrees C (59 to 86 degrees F). Protect from light. Keep container tightly closed. Throw away any unused medicine after the expiration date. NOTE: This sheet is a summary. It may not cover all possible information. If you have questions about this medicine, talk to  your doctor, pharmacist, or health care provider.  2020 Elsevier/Gold Standard (2018-03-30 11:46:46)  Insomnia Insomnia is a sleep disorder that makes it difficult to fall asleep or stay asleep. Insomnia can cause fatigue, low energy, difficulty concentrating, mood swings, and poor performance at work or school. There are three different ways to classify insomnia:  Difficulty falling asleep.  Difficulty staying asleep.  Waking up too early in the morning. Any type of insomnia can be long-term (chronic) or short-term (acute). Both are common. Short-term insomnia usually lasts for three months or less. Chronic insomnia occurs at least three times a week for longer than three months. What are the causes? Insomnia may be caused by another condition, situation, or substance, such as:  Anxiety.  Certain medicines.  Gastroesophageal reflux disease (GERD) or other gastrointestinal conditions.  Asthma or other breathing conditions.  Restless legs syndrome, sleep apnea, or other sleep disorders.  Chronic pain.  Menopause.  Stroke.  Abuse of alcohol, tobacco, or illegal drugs.  Mental health conditions, such as depression.  Caffeine.  Neurological disorders, such as Alzheimer's disease.  An overactive thyroid (hyperthyroidism). Sometimes, the cause of insomnia may not be known. What increases the risk? Risk factors for insomnia include:  Gender. Women are affected more often than men.  Age. Insomnia is more common as you get older.  Stress.  Lack of exercise.  Irregular work schedule or working night shifts.  Traveling between different time zones.  Certain medical and mental health conditions. What are the signs or symptoms? If you have insomnia, the main symptom is having trouble falling asleep or having trouble staying asleep. This may lead to other symptoms, such as:  Feeling fatigued or having low energy.  Feeling nervous about going to sleep.  Not feeling  rested in the morning.  Having trouble concentrating.  Feeling irritable, anxious, or depressed. How is this diagnosed? This condition may be diagnosed based on:  Your symptoms and medical history. Your health care provider may ask about: ? Your sleep habits. ? Any medical conditions you have. ? Your mental health.  A physical exam. How is this treated? Treatment for insomnia depends on the cause. Treatment may focus on treating an underlying condition that is causing insomnia. Treatment may also include:  Medicines to help you sleep.  Counseling or therapy.  Lifestyle adjustments to help you sleep better. Follow these instructions at home: Eating and drinking   Limit or avoid alcohol, caffeinated beverages, and cigarettes, especially close to bedtime. These can disrupt your sleep.  Do not eat a large meal or eat spicy foods right before bedtime. This can lead to digestive discomfort that can make it hard for you to sleep. Sleep habits   Keep a sleep diary to help you and your health care provider figure out what could be causing your insomnia. Write down: ? When you sleep. ? When you wake up during the night. ? How well you sleep. ? How rested you feel the next day. ? Any side effects of medicines you are taking. ? What you eat and drink.  Make your bedroom a dark, comfortable place where it is easy to fall asleep. ? Put up shades or blackout curtains to block light from outside. ? Use a white noise machine to block noise. ? Keep the temperature cool.  Limit screen use before bedtime. This includes: ? Watching TV. ? Using your smartphone, tablet, or computer.  Stick to a routine that includes going to bed and waking up at the same times every day and night. This can help you fall asleep faster. Consider making a quiet activity, such as reading, part of your nighttime routine.  Try to avoid taking naps during the day so that you sleep better at night.  Get out of  bed if you are still awake after 15 minutes of trying to sleep. Keep the lights down, but try reading or doing a quiet activity. When you feel sleepy, go back to bed. General instructions  Take over-the-counter and prescription medicines only as told by your health care provider.  Exercise regularly, as told by your health care provider. Avoid exercise starting several hours before bedtime.  Use relaxation techniques to manage stress. Ask your health care provider to suggest some techniques that may work well for you. These may include: ? Breathing exercises. ? Routines to release muscle tension. ? Visualizing peaceful scenes.  Make sure that you drive carefully. Avoid driving if you feel very sleepy.  Keep all follow-up visits as told by your health care provider. This is important. Contact a health care provider if:  You are tired throughout the day.  You have trouble in your daily routine due to sleepiness.  You continue to have sleep problems, or your sleep problems get worse. Get help right away if:  You have serious thoughts about hurting yourself or someone else. If you ever feel like you may hurt yourself or others, or have thoughts about taking your own life, get help right away. You can go to your nearest emergency department or call:  Your local emergency services (911 in the [place]  A suicide crisis helpline, such as the [facility] at 432-241-0220. This is open 24 hours a day. Summary  Insomnia is a sleep disorder that makes it difficult to fall asleep or stay asleep.  Insomnia can be long-term (chronic) or short-term (acute).  Treatment for insomnia depends on the cause. Treatment may focus on treating an underlying condition that is causing insomnia.  Keep a sleep diary to help you and your health care provider figure out what could be causing your insomnia. This information is not intended to replace advice given to you by your health  care provider. Make sure you discuss any questions you have with your health care provider. Document Released: 04/04/2000 Document Revised: 03/20/2017 Document Reviewed: 01/15/2017 Elsevier Patient Education  2020 [employer].

## 2019-01-10 ENCOUNTER — Ambulatory Visit: Payer: 59 | Admitting: Family Medicine

## 2019-01-22 ENCOUNTER — Other Ambulatory Visit: Payer: Self-pay | Admitting: Family Medicine

## 2019-01-22 DIAGNOSIS — F419 Anxiety disorder, unspecified: Secondary | ICD-10-CM

## 2019-01-26 ENCOUNTER — Ambulatory Visit (INDEPENDENT_AMBULATORY_CARE_PROVIDER_SITE_OTHER)
Admission: RE | Admit: 2019-01-26 | Discharge: 2019-01-26 | Disposition: A | Discharge status: Home / Self Care | Payer: 59 | Source: Ambulatory Visit

## 2019-01-26 ENCOUNTER — Telehealth: Payer: 59 | Admitting: Family

## 2019-01-26 DIAGNOSIS — Z20828 Contact with and (suspected) exposure to other viral communicable diseases: Secondary | ICD-10-CM | POA: Diagnosis not present

## 2019-01-26 DIAGNOSIS — Z20822 Contact with and (suspected) exposure to covid-19: Secondary | ICD-10-CM

## 2019-01-26 DIAGNOSIS — J069 Acute upper respiratory infection, unspecified: Secondary | ICD-10-CM | POA: Diagnosis not present

## 2019-01-26 MED ORDER — FLUTICASONE PROPIONATE 50 MCG/ACT NA SUSP: 2.0000 | Freq: Every day | NASAL | 0 refills | Status: DC

## 2019-01-26 MED ORDER — CETIRIZINE-PSEUDOEPHEDRINE ER 5-120 MG PO TB12: 1.0000 | ORAL_TABLET | Freq: Every day | ORAL | 0 refills | Status: DC

## 2019-01-26 MED ORDER — BENZONATATE 100 MG PO CAPS: 100.0000 mg | ORAL_CAPSULE | Freq: Three times a day (TID) | ORAL | 0 refills | Status: DC | PRN

## 2019-01-26 MED ORDER — BENZONATATE 100 MG PO CAPS: 100.0000 mg | ORAL_CAPSULE | Freq: Three times a day (TID) | ORAL | 0 refills | Status: DC

## 2019-01-26 NOTE — Progress Notes (Signed)
E-Visit for Corona Virus Screening   Your current symptoms could be consistent with the coronavirus.  Many health care providers can now test patients at their office but not all are.  [place] has multiple testing sites. For information on our COVID testing locations and hours go to [email or web address]  Please quarantine yourself while awaiting your test results.  We are enrolling you in our [place] for [place] . Daily you will receive a questionnaire within the [place] [facility] website. Our COVID 19 response team willl be monitoriing your responses daily.  Approximately 5 minutes was spent documenting and reviewing patient's chart.    COVID-19 is a respiratory illness with symptoms that are similar to the flu. Symptoms are typically mild to moderate, but there have been cases of severe illness and death due to the virus. The following symptoms may appear 2-14 days after exposure: . Fever . Cough . Shortness of breath or difficulty breathing . Chills . Repeated shaking with chills . Muscle pain . Headache . Sore throat . New loss of taste or smell . Fatigue . Congestion or runny nose . Nausea or vomiting . Diarrhea  It is vitally important that if you feel that you have an infection such as this virus or any other virus that you stay home and away from places where you may spread it to others.  You should self-quarantine for 14 days if you have symptoms that could potentially be coronavirus or have been in close contact a with a person diagnosed with COVID-19 within the last 2 weeks. You should avoid contact with people age 87 and older.   You can go to one of the  testing sites listed below, while they are opened (see hours). You do not need an order and will stay in your car during the test. You do need to self isolate until your results return and if positive 14 days from when your symptoms started and until you are 3 days symptom free.   Testing  Locations (Monday - Friday, 8 a.m. - 3:30 p.m.) . [place]: [facility] at [facility], [address] [place], [place]: [place], [place], [place], [place] (entrance off [address])  . [facility]: (Closed each Monday): Testing site relocated to the short stay covered drive at [facility]. (Use the [employer] entrance to [facility] next to [place] should wear a mask or cloth face covering over your nose and mouth if you must be around other people or animals, including pets (even at home). Try to stay at least 6 feet away from other people. This will protect the people around you.  You can use medication such as A prescription cough medication called Tessalon Perles 100 mg. You may take 1-2 capsules every 8 hours as needed for cough.  You may also take acetaminophen (Tylenol) as needed for fever.   Reduce your risk of any infection by using the same precautions used for avoiding the common cold or flu:  Marland Kitchen Wash your hands often with soap and warm water for at least 20 seconds.  If soap and water are not readily available, use an alcohol-based hand sanitizer with at least 60% alcohol.  . If coughing or sneezing, cover your mouth and nose by coughing or sneezing into the elbow areas of your shirt or coat, into a tissue or into your sleeve (not your hands). . Avoid shaking hands  with others and consider head nods or verbal greetings only. . Avoid touching your eyes, nose, or mouth with unwashed hands.  . Avoid close contact with people who are sick. . Avoid places or events with large numbers of people in one location, like concerts or sporting events. . Carefully consider travel plans you have or are making. . If you are planning any travel outside or inside the [place], visit the CDC's Travelers' Health webpage for the latest health notices. . If you have some symptoms but not all  symptoms, continue to monitor at home and seek medical attention if your symptoms worsen. . If you are having a medical emergency, call 911.  HOME CARE . Only take medications as instructed by your medical team. . Drink plenty of fluids and get plenty of rest. . A steam or ultrasonic humidifier can help if you have congestion.   GET HELP RIGHT AWAY IF YOU HAVE EMERGENCY WARNING SIGNS** FOR COVID-19. If you or someone is showing any of these signs seek emergency medical care immediately. Call 911 or proceed to your closest emergency facility if: . You develop worsening high fever. . Trouble breathing . Bluish lips or face . Persistent pain or pressure in the chest . New confusion . Inability to wake or stay awake . You cough up blood. . Your symptoms become more severe  **This list is not all possible symptoms. Contact your medical provider for any symptoms that are sever or concerning to you.   MAKE SURE YOU   Understand these instructions.  Will watch your condition.  Will get help right away if you are not doing well or get worse.  Your e-visit answers were reviewed by a board certified advanced clinical practitioner to complete your personal care plan.  Depending on the condition, your plan could have included both over the counter or prescription medications.  If there is a problem please reply once you have received a response from your provider.  Your safety is important to [place].  If you have drug allergies check your prescription carefully.    You can use MyChart to ask questions about today's visit, request a non-urgent call back, or ask for a work or school excuse for 24 hours related to this e-Visit. If it has been greater than 24 hours you will need to follow up with your provider, or enter a new e-Visit to address those concerns. You will get an e-mail in the next two days asking about your experience.  I hope that your e-visit has been valuable and will speed your  recovery. Thank you for using e-visits.

## 2019-01-26 NOTE — ED Provider Notes (Signed)
April Gregory    Virtual Visit via Video Note:  April Gregory  initiated request for Telemedicine visit with [facility] Urgent Care team. I connected with April Gregory  on 01/26/2019 at 3:48 PM  for a synchronized telemedicine visit using a video enabled HIPPA compliant telemedicine application. I verified that I am speaking with April Gregory  using two identifiers. April Box, PA-C  was physically located in a [facility] Urgent care site and April Gregory was located at a different location.   The limitations of evaluation and management by telemedicine as well as the availability of in-person appointments were discussed. Patient was informed that she  may incur a bill ( including co-pay) for this virtual visit encounter. April Gregory  expressed understanding and gave verbal consent to proceed with virtual visit.  [id number] 01/26/19 Arrival Time: 1532  Cc: COUGH  SUBJECTIVE:  April Gregory is a 38 y.o. female who presents with sneezing, productive cough, chest congestion, chest tightness, runny nose, nasal congestion, headache, fatigue, 3 episodes of watery stools, subjective fever, and chills x 2 days.  Denies known exposure to COVID, flu or strep.  However, reports husband with similar symptoms. He was tested for COVID a few weeks ago, and his test was negative at that time. Denies recent travel.  Has tried OTC mucinex without relief.  Symptoms are made worse with activity.  Denies previous symptoms in the past.   Denies sore throat, SOB, wheezing, chest pain, nausea, vomiting, changes in bowel or bladder habits.    ROS: As per HPI.  All other pertinent ROS negative.     Past Medical History:  Diagnosis Date  . Anemia   . Anxiety   . Infection    UTI  . Insomnia 10/2018  . MRSA (methicillin resistant staph aureus) culture positive   . Pregnancy induced hypertension   . Spinal headache   . Vaginal Pap smear, abnormal    Past Surgical History:   Procedure Laterality Date  . ABDOMINAL HYSTERECTOMY    . CESAREAN SECTION    . COLPOSCOPY     Allergies  Allergen Reactions  . Flagyl [Metronidazole] Hives  . Other Shortness Of Breath    Pt allergic to Salmon, causes SOB and itching. Pt also allergic to ant bites causes itching, swelling, hives.  . Bactrim [Sulfamethoxazole-Trimethoprim] Itching  . Penicillins Other (See Comments)    Has patient had a PCN reaction causing immediate rash, facial/tongue/throat swelling, SOB or lightheadedness with hypotension: No Has patient had a PCN reaction causing severe rash involving mucus membranes or skin necrosis: No Has patient had a PCN reaction that required hospitalization No Has patient had a PCN reaction occurring within the last 10 years:yes...5-6 years ago If all of the above answers are "NO", then may proceed with Cephalosporin use.   No current facility-administered medications on file prior to encounter.    Current Outpatient Medications on File Prior to Encounter  Medication Sig Dispense Refill  . busPIRone (BUSPAR) 15 MG tablet Take 1 tablet (15 mg total) by mouth 2 (two) times daily. 60 tablet 3  . traZODone (DESYREL) 50 MG tablet Take 0.5-1 tablets (25-50 mg total) by mouth at bedtime as needed for sleep. 30 tablet 3  . [DISCONTINUED] buPROPion (WELLBUTRIN) 75 MG tablet TAKE 1 TABLET(75 MG) BY MOUTH TWICE DAILY 60 tablet 1  . [DISCONTINUED] ferrous sulfate 325 (65 FE) MG tablet Take 1 tablet (325 mg total) by mouth daily with breakfast. (Patient  not taking: Reported on 03/30/2018) 30 tablet 3      OBJECTIVE:  There were no vitals filed for this visit.   General appearance: alert; appears fatigued, but nontoxic Eyes: EOMI grossly HENT: normocephalic; atraumatic Neck: supple with FROM Lungs: normal respiratory effort; speaking in full sentences without difficulty; moderate cough Extremities: moves extremities without difficulty Skin: No obvious rashes Neurologic: No  facial asymmetries Psychological: alert and cooperative; normal mood and affect   ASSESSMENT & PLAN:  1. Encounter by telehealth for suspected COVID-19   2. Viral URI with cough     Meds ordered this encounter  Medications  . cetirizine-pseudoephedrine (ZYRTEC-D) 5-120 MG tablet    Sig: Take 1 tablet by mouth daily.    Dispense:  30 tablet    Refill:  0    Order Specific Question:   Supervising Provider    Answer:   Eustace Moore [[id number]]  . fluticasone (FLONASE) 50 MCG/ACT nasal spray    Sig: Place 2 sprays into both nostrils daily.    Dispense:  16 g    Refill:  0    Order Specific Question:   Supervising Provider    Answer:   Eustace Moore [[id number]]  . benzonatate (TESSALON) 100 MG capsule    Sig: Take 1 capsule (100 mg total) by mouth every 8 (eight) hours.    Dispense:  21 capsule    Refill:  0    Order Specific Question:   Supervising Provider    Answer:   Eustace Moore [[id number]]    Declines COVID testing at this time.  If you change your mind you may go to [address]  In [place] for drive-thru testing M-F 8-4.    In the meantime: You should remain isolated in your home for 10 days from symptom onset AND greater than 72 hours after symptoms resolution (absence of fever without the use of fever-reducing medication and improvement in respiratory symptoms), whichever is longer Get plenty of rest and push fluids Tessalon Perles prescribed for cough Zyrtec-D prescribed for nasal congestion, runny nose, and/or sore throat Flonase prescribed for nasal congestion and runny nose Use medications daily for symptom relief Use OTC medications like ibuprofen or tylenol as needed fever or pain Call, follow up in person, or go to the ED if you have any new or worsening symptoms such as fever, worsening cough, shortness of breath, chest tightness, chest pain, turning blue, changes in mental status, etc...   I discussed the assessment and treatment plan  with the patient. The patient was provided an opportunity to ask questions and all were answered. The patient agreed with the plan and demonstrated an understanding of the instructions.   The patient was advised to call back or seek an in-person evaluation if the symptoms worsen or if the condition fails to improve as anticipated.  I provided 13 minutes of non-face-to-face time during this encounter.  Rennis Harding, PA-C  01/26/2019 3:48 PM           Rennis Harding, PA-C 01/26/19 1548

## 2019-01-26 NOTE — Discharge Instructions (Addendum)
Declines COVID testing at this time.  If you change your mind you may go to [place]  In [place] for drive-thru testing M-F 8-4.    In the meantime: You should remain isolated in your home for 10 days from symptom onset AND greater than 72 hours after symptoms resolution (absence of fever without the use of fever-reducing medication and improvement in respiratory symptoms), whichever is longer Get plenty of rest and push fluids Tessalon Perles prescribed for cough Zyrtec-D prescribed for nasal congestion, runny nose, and/or sore throat.  DO NOT TAKE THIS MEDICATION if breast feeding or pregnant Flonase prescribed for nasal congestion and runny nose Use medications daily for symptom relief Use OTC medications like ibuprofen or tylenol as needed fever or pain Call, follow up in person, or go to the ED if you have any new or worsening symptoms such as fever, worsening cough, shortness of breath, chest tightness, chest pain, turning blue, changes in mental status, etc..Marland Kitchen

## 2019-01-27 ENCOUNTER — Encounter (INDEPENDENT_AMBULATORY_CARE_PROVIDER_SITE_OTHER): Payer: Self-pay

## 2019-01-27 ENCOUNTER — Other Ambulatory Visit: Payer: Self-pay

## 2019-01-27 DIAGNOSIS — Z20822 Contact with and (suspected) exposure to covid-19: Secondary | ICD-10-CM

## 2019-01-28 ENCOUNTER — Encounter (INDEPENDENT_AMBULATORY_CARE_PROVIDER_SITE_OTHER): Payer: Self-pay

## 2019-01-28 LAB — NOVEL CORONAVIRUS, NAA: SARS-CoV-2, NAA: NOT DETECTED

## 2019-01-31 ENCOUNTER — Encounter (INDEPENDENT_AMBULATORY_CARE_PROVIDER_SITE_OTHER): Payer: Self-pay

## 2019-02-01 ENCOUNTER — Encounter (INDEPENDENT_AMBULATORY_CARE_PROVIDER_SITE_OTHER): Payer: Self-pay

## 2019-02-04 ENCOUNTER — Encounter (INDEPENDENT_AMBULATORY_CARE_PROVIDER_SITE_OTHER): Payer: Self-pay

## 2019-02-07 ENCOUNTER — Other Ambulatory Visit: Payer: Self-pay

## 2019-02-07 ENCOUNTER — Ambulatory Visit: Payer: 59 | Admitting: Family Medicine

## 2019-02-09 IMAGING — US US MFM OB TRANSVAGINAL
1 series · 13 of 28 positions shown · non-contrast
Comparison: none

[Series 1: us mfm ob transvaginal · 61 acquisitions, 13 frames shown]
[im 3/61]
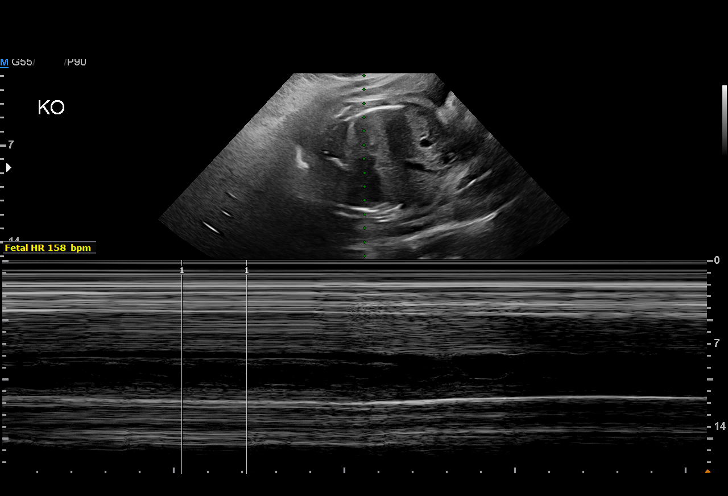
[im 7/61]
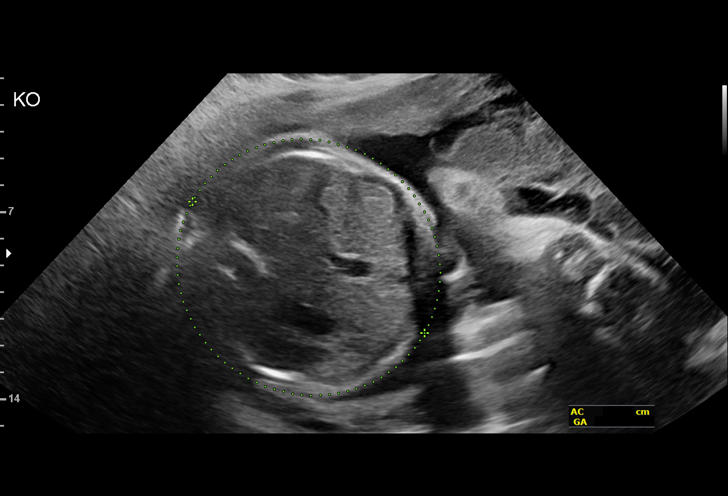
[im 12/61]
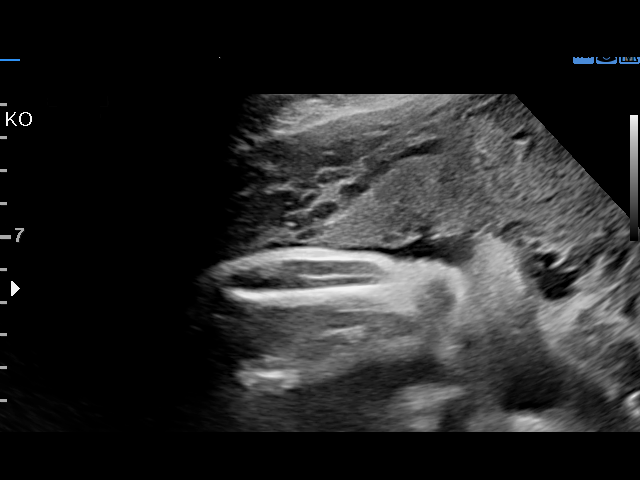
[im 16/61]
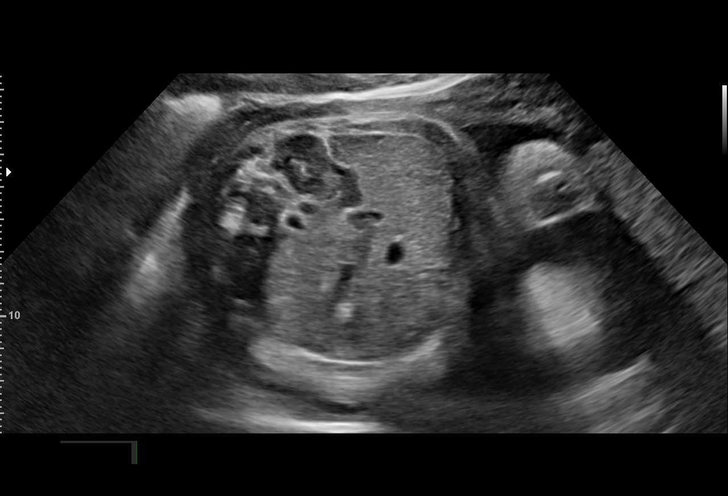
[im 21/61]
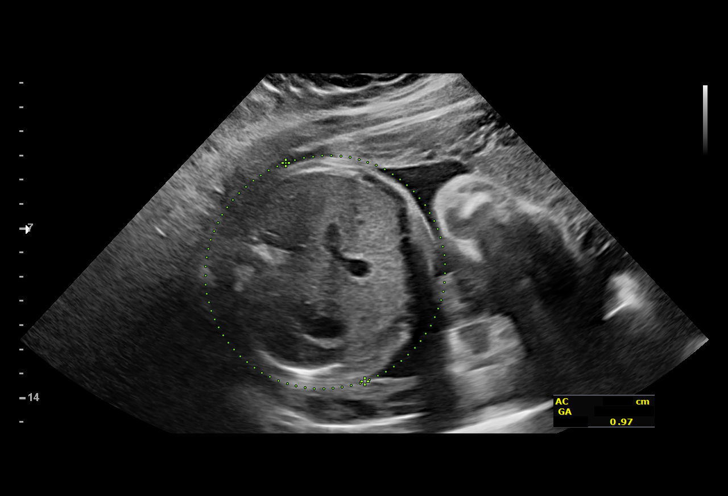
[im 25/61]
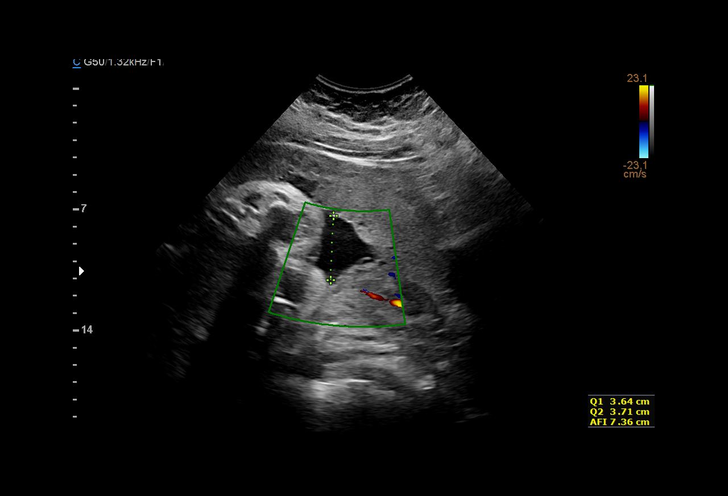
[im 32/61]
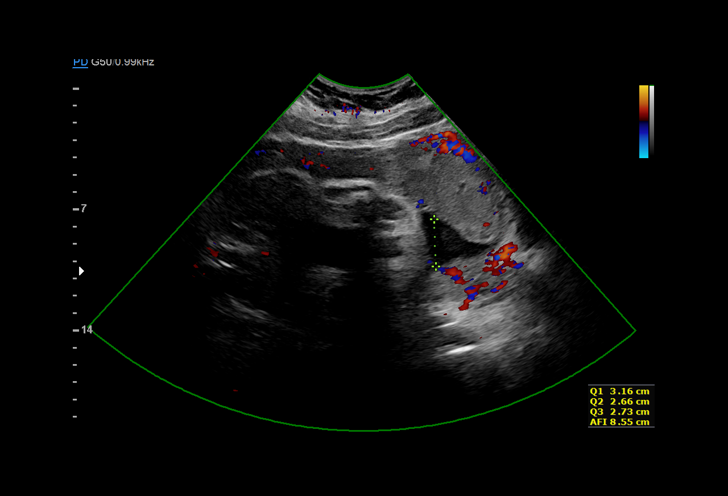
[im 36/61]
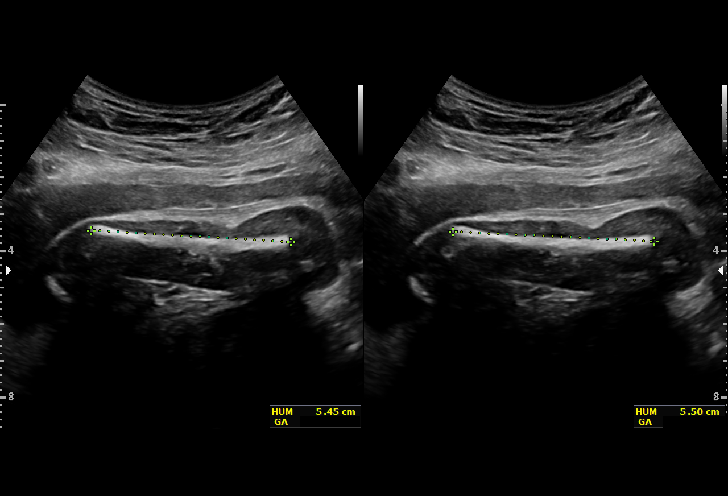
[im 41/61]
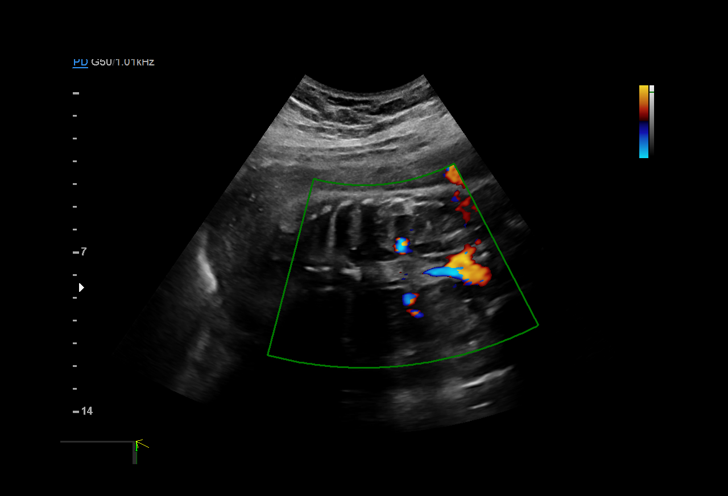
[im 45/61]
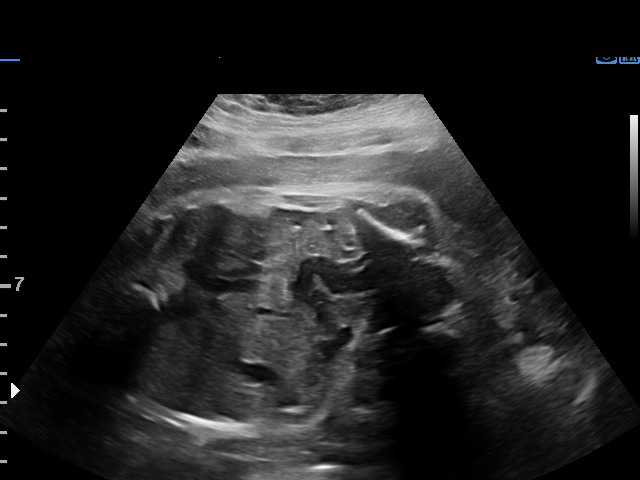
[im 49/61]
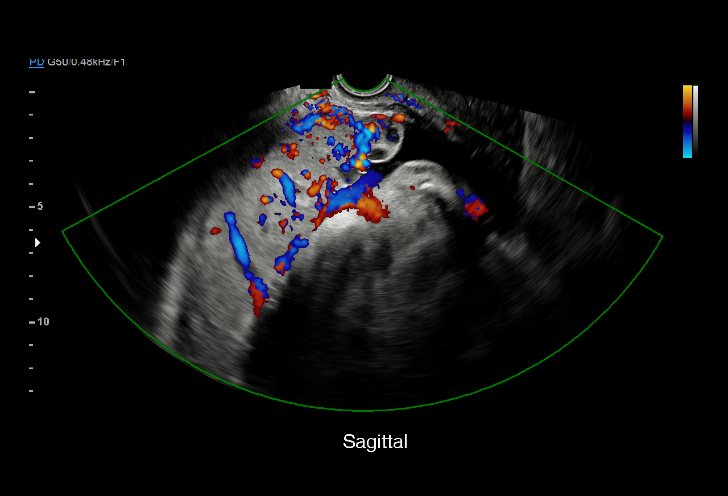
[im 54/61]
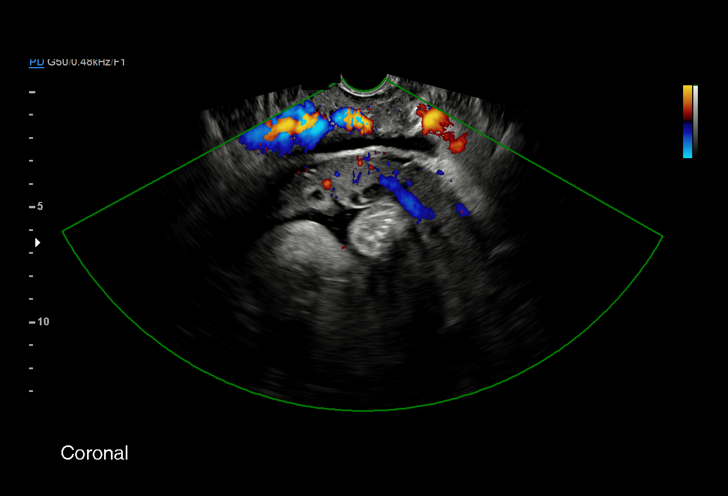
[im 58/61]
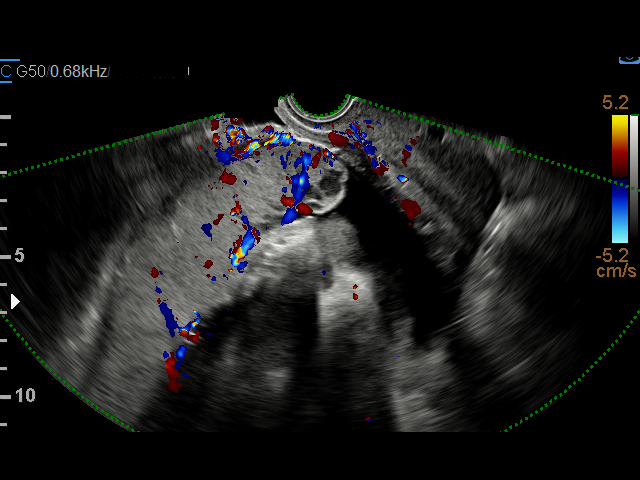

[13 of 28 positions shown; findings below may reference images not displayed]

[REDACTED]care - [HOSPITAL]

1  SANG MEEN HO MIN              [id number]      [id number]     [id number]
2  SANG MEEN HO MIN              [id number]      [id number]     [id number]
3  SANG MEEN HO MIN              [id number]      [id number]     [id number]
Indications

32 weeks gestation of pregnancy
Previous cesarean delivery, antepartum x 3
Hypertension - Chronic/Pre-existing (no
meds)
Maternal morbid obesity (pregravid BMI 51)
Encounter for other antenatal screening
follow-up
OB History

Gravidity:    4         Term:   3        Prem:   0        SAB:   0
TOP:          0       Ectopic:  0        Living: 3
Fetal Evaluation

Num Of Fetuses:     1
Fetal Heart         158
Rate(bpm):
Cardiac Activity:   Observed
Presentation:       Breech
Placenta:           Anterior, above cervical os
P. Cord Insertion:  Previously Visualized

Amniotic Fluid
AFI FV:      Subjectively within normal limits
AFI Sum(cm)     %Tile       Largest Pocket(cm)
12.83           38

RUQ(cm)       RLQ(cm)       LUQ(cm)        LLQ(cm)
3.4
Biophysical Evaluation

Amniotic F.V:   Within normal limits       F. Tone:        Observed
F. Movement:    Observed                   Score:          [DATE]
F. Breathing:   Observed
Biometry

BPD:      82.6  mm     G. Age:  33w 2d         66  %    CI:        76.14   %    70 - 86
FL/HC:      20.9   %    19.1 -
HC:       300   mm     G. Age:  33w 2d         35  %    HC/AC:      0.99        0.96 -
AC:      302.8  mm     G. Age:  34w 2d         91  %    FL/BPD:     76.0   %    71 - 87
FL:       62.8  mm     G. Age:  32w 4d         40  %    FL/AC:      20.7   %    20 - 24
HUM:      54.7  mm     G. Age:  31w 6d         42  %

Est. FW:    4444  gm    4 lb 14 oz      77  %
Gestational Age

LMP:           34w 4d        Date:  11/01/16                 EDD:   08/08/17
U/S Today:     33w 3d                                        EDD:   08/16/17
Best:          32w 3d     Det. By:  Early Ultrasound         EDD:   08/23/17
(12/27/16)
Anatomy

Cranium:               Appears normal         Aortic Arch:            Previously seen
Cavum:                 Previously seen        Ductal Arch:            Previously seen
Ventricles:            Previously seen        Diaphragm:              Previously seen
Choroid Plexus:        Previously seen        Stomach:                Appears normal, left
sided
Cerebellum:            Previously seen        Abdomen:                Appears normal
Posterior Fossa:       Previously seen        Abdominal Wall:         Previously seen
Nuchal Fold:           Previously seen        Cord Vessels:           Previously seen
Face:                  Orbits and profile     Kidneys:                Appear normal
previously seen
Lips:                  Previously seen        Bladder:                Appears normal
Thoracic:              Appears normal         Spine:                  Previously seen
Heart:                 Previously seen        Upper Extremities:      Previously seen
RVOT:                  Previously seen        Lower Extremities:      Previously seen
LVOT:                  Previously seen

Other:  Female gender previously seen. Heels and right 5th digit previously
visualized. Nasal bone previously visualized. Technically difficult due
to maternal habitus.
Cervix Uterus Adnexa

Cervix
Normal appearance by transabdominal scan.

Uterus
No abnormality visualized.
Left Ovary
Not visualized.

Right Ovary
Not visualized.

Cul De Sac:   No free fluid seen.

Adnexa:       No abnormality visualized.
Comments

The inferior edge of the placenta is implanted over the area of
her previous uterine incisions.  Given that she has had 3 prior
cesarean deliveries the a priori risk that she has invasive
placentation is high.  On ultrasound there are some focal
areas where the sonolucent interface between the placenta
and the uterus has been lost.  However, it is mostly in tact so
I suspect there are only focal areas of accreta and not a
placenta increta or percreta.  Given these findings there is a
low to moderate risk of postpartum hemorrhage and the need
for a hysterectomy at the time of delivery.  I have discussed
these issues with Dr. Corotheritheri who agrees that Ms. Olvera
should be transferred to the [facility] in [HOSPITAL[facility] for
delivery.  This will be arranged by his office.
Impression

Single living intrauterine pregnancy at 32 weeks 3 days.
Appropriate interval fetal growth (%).
Normal amniotic fluid volume.
Normal interval fetal anatomy.
BPP [DATE].
Suspected placenta accreta (see comments).
Recommendations

Continue serial growth ultrasounds and antenatal fetal testing.

## 2019-02-20 ENCOUNTER — Other Ambulatory Visit: Payer: Self-pay | Admitting: Family Medicine

## 2019-02-20 DIAGNOSIS — G4700 Insomnia, unspecified: Secondary | ICD-10-CM

## 2019-10-29 ENCOUNTER — Other Ambulatory Visit: Payer: Self-pay

## 2019-10-29 ENCOUNTER — Emergency Department (HOSPITAL_COMMUNITY)
Admission: EM | Admit: 2019-10-29 | Discharge: 2019-10-29 | Disposition: A | Discharge status: Home / Self Care | Payer: No Typology Code available for payment source | Attending: Emergency Medicine | Admitting: Emergency Medicine

## 2019-10-29 ENCOUNTER — Encounter (HOSPITAL_COMMUNITY): Payer: Self-pay

## 2019-10-29 ENCOUNTER — Emergency Department (HOSPITAL_COMMUNITY): Payer: No Typology Code available for payment source

## 2019-10-29 DIAGNOSIS — Y939 Activity, unspecified: Secondary | ICD-10-CM | POA: Diagnosis not present

## 2019-10-29 DIAGNOSIS — S52601A Unspecified fracture of lower end of right ulna, initial encounter for closed fracture: Secondary | ICD-10-CM | POA: Diagnosis not present

## 2019-10-29 DIAGNOSIS — Z79899 Other long term (current) drug therapy: Secondary | ICD-10-CM | POA: Diagnosis not present

## 2019-10-29 DIAGNOSIS — M79632 Pain in left forearm: Secondary | ICD-10-CM

## 2019-10-29 DIAGNOSIS — Y92009 Unspecified place in unspecified non-institutional (private) residence as the place of occurrence of the external cause: Secondary | ICD-10-CM | POA: Insufficient documentation

## 2019-10-29 DIAGNOSIS — Y999 Unspecified external cause status: Secondary | ICD-10-CM | POA: Diagnosis not present

## 2019-10-29 DIAGNOSIS — S59912A Unspecified injury of left forearm, initial encounter: Secondary | ICD-10-CM | POA: Diagnosis present

## 2019-10-29 MED ORDER — NAPROXEN 500 MG PO TABS: 500.0000 mg | ORAL_TABLET | Freq: Two times a day (BID) | ORAL | 0 refills | Status: DC

## 2019-10-29 MED ORDER — NAPROXEN 500 MG PO TABS: 500.0000 mg | ORAL_TABLET | Freq: Two times a day (BID) | ORAL | 0 refills | Status: AC
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
## 2019-10-29 NOTE — ED Notes (Signed)
Pt d/c home per MD order. Discharge summary reviewed with pt, pt verbalizes understanding. Ambulatory off unit. Nos/s of acute distress noted. Reports taking cab home.

## 2019-10-29 NOTE — ED Notes (Signed)
Ortho at bedside.

## 2019-10-29 NOTE — Progress Notes (Signed)
Orthopedic Tech Progress Note Patient Details:  April Gregory April 15, 1981 [id number]  Ortho Devices Type of Ortho Device: Sugartong splint, Arm sling Ortho Device/Splint Location: LUE Ortho Device/Splint Interventions: Ordered, Application   Post Interventions Patient Tolerated: Well Instructions Provided: Care of device   Donald Pore 10/29/2019, 11:46 AM
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
## 2019-10-29 NOTE — ED Notes (Signed)
Pt transported to xray 

## 2019-10-29 NOTE — ED Notes (Signed)
Ortho tech consulted 

## 2019-11-02 ENCOUNTER — Telehealth: Payer: Self-pay | Admitting: Family Medicine

## 2019-11-02 ENCOUNTER — Ambulatory Visit (INDEPENDENT_AMBULATORY_CARE_PROVIDER_SITE_OTHER): Payer: No Typology Code available for payment source | Admitting: Family Medicine

## 2019-11-02 ENCOUNTER — Encounter: Payer: Self-pay | Admitting: Family Medicine

## 2019-11-02 ENCOUNTER — Other Ambulatory Visit: Payer: Self-pay

## 2019-11-02 DIAGNOSIS — S52225A Nondisplaced transverse fracture of shaft of left ulna, initial encounter for closed fracture: Secondary | ICD-10-CM | POA: Diagnosis not present

## 2019-11-02 DIAGNOSIS — S52209A Unspecified fracture of shaft of unspecified ulna, initial encounter for closed fracture: Secondary | ICD-10-CM | POA: Insufficient documentation

## 2019-11-02 MED ORDER — HYDROCODONE-ACETAMINOPHEN 5-325 MG PO TABS: 1.0000 | ORAL_TABLET | Freq: Four times a day (QID) | ORAL | 0 refills | Status: DC | PRN
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
## 2019-11-02 NOTE — Telephone Encounter (Signed)
Rx sent 

## 2019-11-02 NOTE — Telephone Encounter (Signed)
Patient called. She would like to know if Dr. Prince Rome was calling something in to her pharmacy or not. Her call back number is [phone]

## 2019-11-02 NOTE — Telephone Encounter (Signed)
Please advise 

## 2019-11-02 NOTE — Telephone Encounter (Signed)
I called and advised the patient. 
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
## 2019-11-14 ENCOUNTER — Telehealth: Payer: Self-pay | Admitting: Family Medicine

## 2019-11-14 NOTE — Telephone Encounter (Signed)
Forms received from [employer]. Sent to [employer].

## 2019-11-16 ENCOUNTER — Ambulatory Visit (INDEPENDENT_AMBULATORY_CARE_PROVIDER_SITE_OTHER): Payer: No Typology Code available for payment source

## 2019-11-16 ENCOUNTER — Other Ambulatory Visit: Payer: Self-pay

## 2019-11-16 ENCOUNTER — Ambulatory Visit (INDEPENDENT_AMBULATORY_CARE_PROVIDER_SITE_OTHER): Payer: No Typology Code available for payment source | Admitting: Family Medicine

## 2019-11-16 ENCOUNTER — Encounter: Payer: Self-pay | Admitting: Family Medicine

## 2019-11-16 DIAGNOSIS — S52225A Nondisplaced transverse fracture of shaft of left ulna, initial encounter for closed fracture: Secondary | ICD-10-CM
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
## 2019-11-23 ENCOUNTER — Telehealth: Payer: Self-pay | Admitting: Family Medicine

## 2019-11-23 NOTE — Telephone Encounter (Signed)
[employer] forms received. Sent to [employer].

## 2019-11-30 ENCOUNTER — Encounter: Payer: Self-pay | Admitting: Family Medicine

## 2019-11-30 ENCOUNTER — Ambulatory Visit (INDEPENDENT_AMBULATORY_CARE_PROVIDER_SITE_OTHER): Payer: No Typology Code available for payment source

## 2019-11-30 ENCOUNTER — Other Ambulatory Visit: Payer: Self-pay

## 2019-11-30 ENCOUNTER — Ambulatory Visit (INDEPENDENT_AMBULATORY_CARE_PROVIDER_SITE_OTHER): Payer: No Typology Code available for payment source | Admitting: Family Medicine

## 2019-11-30 DIAGNOSIS — S52225A Nondisplaced transverse fracture of shaft of left ulna, initial encounter for closed fracture: Secondary | ICD-10-CM | POA: Diagnosis not present
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
## 2019-12-14 ENCOUNTER — Ambulatory Visit (INDEPENDENT_AMBULATORY_CARE_PROVIDER_SITE_OTHER): Payer: No Typology Code available for payment source

## 2019-12-14 ENCOUNTER — Ambulatory Visit (INDEPENDENT_AMBULATORY_CARE_PROVIDER_SITE_OTHER): Payer: No Typology Code available for payment source | Admitting: Family Medicine

## 2019-12-14 ENCOUNTER — Encounter: Payer: Self-pay | Admitting: Family Medicine

## 2019-12-14 ENCOUNTER — Other Ambulatory Visit: Payer: Self-pay

## 2019-12-14 DIAGNOSIS — S52225A Nondisplaced transverse fracture of shaft of left ulna, initial encounter for closed fracture: Secondary | ICD-10-CM | POA: Diagnosis not present
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
## 2019-12-14 NOTE — Progress Notes (Signed)
I saw and examined the patient with Dr. Marga Hoots and agree with assessment and plan as outlined.    Clinically healing ulna fracture, with some numbness in ulnar distribution.  Will try OT.  Return as needed.

## 2020-05-02 ENCOUNTER — Telehealth: Payer: No Typology Code available for payment source | Admitting: Family

## 2020-05-02 DIAGNOSIS — R109 Unspecified abdominal pain: Secondary | ICD-10-CM

## 2020-05-02 DIAGNOSIS — R399 Unspecified symptoms and signs involving the genitourinary system: Secondary | ICD-10-CM

## 2020-05-02 NOTE — Progress Notes (Signed)
Based on what you shared with me, I feel your condition warrants further evaluation and I recommend that you be seen for a face to face office visit.  Given you are having UTI symptoms and back pain you need to be seen face to face to rule out a more serious infection.    NOTE: If you entered your credit card information for this eVisit, you will not be charged. You may see a "hold" on your card for the $35 but that hold will drop off and you will not have a charge processed.   If you are having a true medical emergency please call 911.      For an urgent face to face visit, [place] has five urgent care centers for your convenience:     [facility] at [facility] Directions [phone] [address] [place], [place] [address] . 10 am - 6pm Monday - Friday    [facility]) Get Driving Directions [phone] [address] [place], [place] [address] . 10 am to 8 pm Monday-Friday . 12 pm to 8 pm [facility] Ur[facility] at [facility] Get Driving Directions [phone] [address], [address] [place], [place] [address] . 8 am to 8 pm Monday-Friday . 9 am to 6 pm Saturday . 11 am to 6 pm Sunday     [facility] Urgent Care at [facility] Get Driving Directions  [phone] [address] [place], [place] [address] . 8 am to 8 pm Monday-Friday . 8 am to 4 pm [facility] Care at [facility] Directions [phone] [address] [place], [place] [address] . 12 pm to 6 pm Monday-Friday      Your e-visit answers were reviewed by a board certified advanced clinical practitioner to complete your personal care plan.  Thank you for using e-Visits.

## 2020-05-18 ENCOUNTER — Other Ambulatory Visit: Payer: Self-pay

## 2020-05-18 ENCOUNTER — Ambulatory Visit (INDEPENDENT_AMBULATORY_CARE_PROVIDER_SITE_OTHER): Payer: No Typology Code available for payment source | Admitting: Family Medicine

## 2020-05-18 ENCOUNTER — Encounter: Payer: Self-pay | Admitting: Family Medicine

## 2020-05-18 VITALS — BP 143/80 | HR 71 | Temp 97.5°F | Ht 71.0 in | Wt 356.0 lb

## 2020-05-18 DIAGNOSIS — Z09 Encounter for follow-up examination after completed treatment for conditions other than malignant neoplasm: Secondary | ICD-10-CM

## 2020-05-18 DIAGNOSIS — F419 Anxiety disorder, unspecified: Secondary | ICD-10-CM | POA: Diagnosis not present

## 2020-05-18 DIAGNOSIS — G4730 Sleep apnea, unspecified: Secondary | ICD-10-CM

## 2020-05-18 DIAGNOSIS — L732 Hidradenitis suppurativa: Secondary | ICD-10-CM | POA: Diagnosis not present

## 2020-05-18 DIAGNOSIS — Z6841 Body Mass Index (BMI) 40.0 and over, adult: Secondary | ICD-10-CM

## 2020-05-18 DIAGNOSIS — Z0000 Encounter for general adult medical examination without abnormal findings: Secondary | ICD-10-CM

## 2020-05-18 DIAGNOSIS — E66813 Obesity, class 3: Secondary | ICD-10-CM

## 2020-05-18 MED ORDER — BUPROPION HCL ER (SR) 150 MG PO TB12: 150.0000 mg | ORAL_TABLET | Freq: Two times a day (BID) | ORAL | 11 refills | Status: AC
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
## 2020-05-24 ENCOUNTER — Ambulatory Visit: Payer: No Typology Code available for payment source | Admitting: Nurse Practitioner

## 2020-05-28 ENCOUNTER — Ambulatory Visit: Payer: Self-pay | Admitting: Surgery

## 2020-06-05 ENCOUNTER — Encounter: Payer: Self-pay | Admitting: *Deleted

## 2020-06-13 ENCOUNTER — Other Ambulatory Visit: Payer: Self-pay

## 2020-06-13 ENCOUNTER — Encounter: Payer: Self-pay | Admitting: Nurse Practitioner

## 2020-06-13 ENCOUNTER — Ambulatory Visit (INDEPENDENT_AMBULATORY_CARE_PROVIDER_SITE_OTHER): Payer: No Typology Code available for payment source | Admitting: Nurse Practitioner

## 2020-06-13 VITALS — BP 141/71 | HR 87 | Temp 97.4°F | Ht 71.0 in | Wt 352.0 lb

## 2020-06-13 DIAGNOSIS — R350 Frequency of micturition: Secondary | ICD-10-CM | POA: Diagnosis not present

## 2020-06-13 DIAGNOSIS — F32A Depression, unspecified: Secondary | ICD-10-CM

## 2020-06-13 DIAGNOSIS — F419 Anxiety disorder, unspecified: Secondary | ICD-10-CM

## 2020-06-13 DIAGNOSIS — Z6841 Body Mass Index (BMI) 40.0 and over, adult: Secondary | ICD-10-CM

## 2020-06-13 DIAGNOSIS — Z87448 Personal history of other diseases of urinary system: Secondary | ICD-10-CM | POA: Diagnosis not present

## 2020-06-13 DIAGNOSIS — Z131 Encounter for screening for diabetes mellitus: Secondary | ICD-10-CM | POA: Diagnosis not present

## 2020-06-13 DIAGNOSIS — L732 Hidradenitis suppurativa: Secondary | ICD-10-CM

## 2020-06-13 LAB — POCT GLYCOSYLATED HEMOGLOBIN (HGB A1C): Hemoglobin A1C: 5 % (ref 4.0–5.6)

## 2020-06-13 LAB — POCT URINALYSIS DIPSTICK
Bilirubin, UA: NEGATIVE
Glucose, UA: NEGATIVE
Ketones, UA: NEGATIVE
Nitrite, UA: NEGATIVE
Protein, UA: POSITIVE — AB
Spec Grav, UA: 1.025 (ref 1.010–1.025)
Urobilinogen, UA: 0.2 E.U./dL
pH, UA: 6 (ref 5.0–8.0)

## 2020-06-13 LAB — POCT CBG (FASTING - GLUCOSE)-MANUAL ENTRY: Glucose Fasting, POC: 111 mg/dL — AB (ref 70–99)

## 2020-06-13 MED ORDER — NITROFURANTOIN MONOHYD MACRO 100 MG PO CAPS
100.0000 mg | ORAL_CAPSULE | Freq: Two times a day (BID) | ORAL | 0 refills | Status: AC
Start: 2020-06-13 — End: 2020-06-18

## 2020-06-13 NOTE — Patient Instructions (Signed)
Health Maintenance, Female Adopting a healthy lifestyle and getting preventive care are important in promoting health and wellness. Ask your health care provider about:  The right schedule for you to have regular tests and exams.  Things you can do on your own to prevent diseases and keep yourself healthy. What should I know about diet, weight, and exercise? Eat a healthy diet  Eat a diet that includes plenty of vegetables, fruits, low-fat dairy products, and lean protein.  Do not eat a lot of foods that are high in solid fats, added sugars, or sodium.   Maintain a healthy weight Body mass index (BMI) is used to identify weight problems. It estimates body fat based on height and weight. Your health care provider can help determine your BMI and help you achieve or maintain a healthy weight. Get regular exercise Get regular exercise. This is one of the most important things you can do for your health. Most adults should:  Exercise for at least 150 minutes each week. The exercise should increase your heart rate and make you sweat (moderate-intensity exercise).  Do strengthening exercises at least twice a week. This is in addition to the moderate-intensity exercise.  Spend less time sitting. Even light physical activity can be beneficial. Watch cholesterol and blood lipids Have your blood tested for lipids and cholesterol at 38 years of age, then have this test every 5 years. Have your cholesterol levels checked more often if:  Your lipid or cholesterol levels are high.  You are older than 38 years of age.  You are at high risk for heart disease. What should I know about cancer screening? Depending on your health history and family history, you may need to have cancer screening at various ages. This may include screening for:  Breast cancer.  Cervical cancer.  Colorectal cancer.  Skin cancer.  Lung cancer. What should I know about heart disease, diabetes, and high blood  pressure? Blood pressure and heart disease  High blood pressure causes heart disease and increases the risk of stroke. This is more likely to develop in people who have high blood pressure readings, are of African descent, or are overweight.  Have your blood pressure checked: ? Every 3-5 years if you are 18-39 years of age. ? Every year if you are 40 years old or older. Diabetes Have regular diabetes screenings. This checks your fasting blood sugar level. Have the screening done:  Once every three years after age 40 if you are at a normal weight and have a low risk for diabetes.  More often and at a younger age if you are overweight or have a high risk for diabetes. What should I know about preventing infection? Hepatitis B If you have a higher risk for hepatitis B, you should be screened for this virus. Talk with your health care provider to find out if you are at risk for hepatitis B infection. Hepatitis C Testing is recommended for:  Everyone born from 1945 through 1965.  Anyone with known risk factors for hepatitis C. Sexually transmitted infections (STIs)  Get screened for STIs, including gonorrhea and chlamydia, if: ? You are sexually active and are younger than 38 years of age. ? You are older than 38 years of age and your health care provider tells you that you are at risk for this type of infection. ? Your sexual activity has changed since you were last screened, and you are at increased risk for chlamydia or gonorrhea. Ask your health care provider   if you are at risk.  Ask your health care provider about whether you are at high risk for HIV. Your health care provider may recommend a prescription medicine to help prevent HIV infection. If you choose to take medicine to prevent HIV, you should first get tested for HIV. You should then be tested every 3 months for as long as you are taking the medicine. Pregnancy  If you are about to stop having your period (premenopausal) and  you may become pregnant, seek counseling before you get pregnant.  Take 400 to 800 micrograms (mcg) of folic acid every day if you become pregnant.  Ask for birth control (contraception) if you want to prevent pregnancy. Osteoporosis and menopause Osteoporosis is a disease in which the bones lose minerals and strength with aging. This can result in bone fractures. If you are 65 years old or older, or if you are at risk for osteoporosis and fractures, ask your health care provider if you should:  Be screened for bone loss.  Take a calcium or vitamin D supplement to lower your risk of fractures.  Be given hormone replacement therapy (HRT) to treat symptoms of menopause. Follow these instructions at home: Lifestyle  Do not use any products that contain nicotine or tobacco, such as cigarettes, e-cigarettes, and chewing tobacco. If you need help quitting, ask your health care provider.  Do not use street drugs.  Do not share needles.  Ask your health care provider for help if you need support or information about quitting drugs. Alcohol use  Do not drink alcohol if: ? Your health care provider tells you not to drink. ? You are pregnant, may be pregnant, or are planning to become pregnant.  If you drink alcohol: ? Limit how much you use to 0-1 drink a day. ? Limit intake if you are breastfeeding.  Be aware of how much alcohol is in your drink. In the [place] one drink equals one 12 oz bottle of beer (355 mL), one 5 oz glass of wine (148 mL), or one 1 oz glass of hard liquor (44 mL). General instructions  Schedule regular health, dental, and eye exams.  Stay current with your vaccines.  Tell your health care provider if: ? You often feel depressed. ? You have ever been abused or do not feel safe at home. Summary  Adopting a healthy lifestyle and getting preventive care are important in promoting health and wellness.  Follow your health care provider's instructions about healthy  diet, exercising, and getting tested or screened for diseases.  Follow your health care provider's instructions on monitoring your cholesterol and blood pressure. This information is not intended to replace advice given to you by your health care provider. Make sure you discuss any questions you have with your health care provider. Document Revised: 03/31/2018 Document Reviewed: 03/31/2018 Elsevier Patient Education  2021 [employer]  
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
## 2020-06-15 LAB — URINE CULTURE

## 2020-07-11 ENCOUNTER — Ambulatory Visit: Payer: No Typology Code available for payment source | Admitting: Family Medicine

## 2020-07-11 NOTE — Progress Notes (Deleted)
Patient no-showed today's appointment; provider notified for review of record.
